# Patient Record
Sex: Female | Born: 1955 | Race: White | Hispanic: No | Marital: Married | Smoking: Never smoker
Health system: Southern US, Community
[De-identification: ages and names within clinical notes are randomized; demographics above are authoritative.]

## PROBLEM LIST (undated history)

## (undated) DIAGNOSIS — K648 Other hemorrhoids: Secondary | ICD-10-CM

## (undated) DIAGNOSIS — K5790 Diverticulosis of intestine, part unspecified, without perforation or abscess without bleeding: Secondary | ICD-10-CM

## (undated) DIAGNOSIS — C50919 Malignant neoplasm of unspecified site of unspecified female breast: Secondary | ICD-10-CM

## (undated) DIAGNOSIS — H269 Unspecified cataract: Secondary | ICD-10-CM

## (undated) DIAGNOSIS — I10 Essential (primary) hypertension: Secondary | ICD-10-CM

## (undated) DIAGNOSIS — M1990 Unspecified osteoarthritis, unspecified site: Secondary | ICD-10-CM

## (undated) HISTORY — DX: Unspecified cataract: H26.9

## (undated) HISTORY — DX: Essential (primary) hypertension: I10

## (undated) HISTORY — PX: LIPOMA EXCISION: SHX5283

## (undated) HISTORY — DX: Unspecified osteoarthritis, unspecified site: M19.90

## (undated) HISTORY — PX: BREAST LUMPECTOMY: SHX2

## (undated) HISTORY — PX: TONSILLECTOMY: SUR1361

## (undated) HISTORY — DX: Other hemorrhoids: K64.8

## (undated) HISTORY — DX: Diverticulosis of intestine, part unspecified, without perforation or abscess without bleeding: K57.90

## (undated) HISTORY — PX: EYE SURGERY: SHX253

## (undated) HISTORY — PX: WISDOM TOOTH EXTRACTION: SHX21

## (undated) HISTORY — PX: LAPAROSCOPIC ASSISTED VAGINAL HYSTERECTOMY: SHX5398

## (undated) HISTORY — DX: Malignant neoplasm of unspecified site of unspecified female breast: C50.919

## (undated) HISTORY — PX: COLONOSCOPY: SHX174

## (undated) MED FILL — Paclitaxel IV Conc 300 MG/50ML (6 MG/ML): INTRAVENOUS | Qty: 49 | Status: AC

---

## 1999-02-18 ENCOUNTER — Other Ambulatory Visit: Admission: RE | Admit: 1999-02-18 | Discharge: 1999-02-18 | Payer: Self-pay | Admitting: Family Medicine

## 2000-02-18 ENCOUNTER — Other Ambulatory Visit: Admission: RE | Admit: 2000-02-18 | Discharge: 2000-02-18 | Payer: Self-pay | Admitting: Family Medicine

## 2001-02-19 ENCOUNTER — Other Ambulatory Visit: Admission: RE | Admit: 2001-02-19 | Discharge: 2001-02-19 | Payer: Self-pay | Admitting: Family Medicine

## 2002-02-25 ENCOUNTER — Other Ambulatory Visit: Admission: RE | Admit: 2002-02-25 | Discharge: 2002-02-25 | Payer: Self-pay | Admitting: Family Medicine

## 2007-06-26 ENCOUNTER — Ambulatory Visit (HOSPITAL_COMMUNITY): Admission: RE | Admit: 2007-06-26 | Discharge: 2007-06-27 | Payer: Self-pay | Admitting: Ophthalmology

## 2011-04-19 NOTE — Op Note (Signed)
Alexandra Singh, Alexandra Singh                ACCOUNT NO.:  [id number]   MEDICAL RECORD NO.:  [id number]          PATIENT TYPE:  AMB   LOCATION:  SDS                          FACILITY:  [facility]   PHYSICIAN:  John D. Ashley Royalty, M.D. DATE OF BIRTH:  18-May-1956   DATE OF PROCEDURE:  06/26/2007  DATE OF DISCHARGE:                               OPERATIVE REPORT   ADMISSION DIAGNOSES:  Rhegmatogenous retinal detachment, right eye.   PROCEDURE PERFORMED:  Scleral buckle, right eye; retinal  photocoagulation, right eye; gas injection, right eye.   SURGEON:  Rudy Jew. Ashley Royalty, M.D.   ASSISTANT SURGEON:  Rosalie Doctor, M.A.   ANESTHESIA:  General.   DESCRIPTION OF PROCEDURE:  The usual prep and drape, 360-degree limbal  peritomy, isolation of four rectus muscles on 2-0 silk.  Localization of  breaks at 12 o'clock.  Sacral dissection for 360 degrees to admit a #279  intrascleral implant.  Additional posterior dissection carried out at  12:30 o'clock to admit the #508-G radial segment.  Diathermy was placed  in the bed.  The #279 implant was placed around the globe with the joint  at 5 o'clock.  A 240 band was placed around the globe with a 270 sleeve  at 5 o'clock.  The perforation site shows at 11 o'clock.  A large amount  clear colorless subretinal fluid came forth.   Perfluoropropane 35% was injected to reinflate the globe.  An additional  perforation site was chosen at 8 o'clock with a moderate amount of clear  colorless subretinal fluid coming forth.  Additional 35% C3F8 was  injected for a total of 2.1 mL.   Indirect ophthalmoscopy showed the retina to be lying nicely on the  scleral buckle with the  breaks well supported.   The indirect ophthalmoscope laser was moved into place.  Then 903 burns  were placed around the retinal periphery and around the retinal breaks.  The power was between 1000 milliwatts, 1,000 microns each, and 0.1  seconds each.  The buckle was adjusted and trimmed.  The  band was  adjusted and trimmed.  The conjunctiva was reposited with 7-0 chromic  suture.  Polymyxin and gentamicin were irrigated into Tenon's space.  Atropine solution was applied.  Marcaine was injected around the globe  for postoperative pain.  Decadron 10 mg was injected into the lower  subconjunctival space.  Closing pressure was 10 a Barraquer tonometer.  TobraDex ophthalmic ointment, a patch and shield were placed.  The  patient was awakened and taken to recovery in satisfactory condition.   COMPLICATIONS:  None.   DURATION:  Operative time was 2-1/2 hours.      Beulah Gandy. Ashley Royalty, M.D.  Electronically Signed     JDM/MEDQ  D:  06/26/2007  T:  06/27/2007  Job:  [id number]

## 2011-09-19 LAB — BASIC METABOLIC PANEL
BUN: 10
CO2: 27
Calcium: 9.1
Chloride: 107
Creatinine, Ser: 0.68
GFR calc Af Amer: >60

## 2011-09-19 LAB — DIFFERENTIAL
Basophils Absolute: 0.1
Basophils Relative: 1
Eosinophils Absolute: 0
Neutro Abs: 3.8
Neutrophils Relative %: 72

## 2011-09-19 LAB — CBC
MCHC: 34.3
MCV: 87.1
Platelets: 435 — ABNORMAL HIGH
RDW: 13.5

## 2013-02-13 ENCOUNTER — Encounter (INDEPENDENT_AMBULATORY_CARE_PROVIDER_SITE_OTHER): Payer: Managed Care, Other (non HMO) | Admitting: Ophthalmology

## 2013-02-13 DIAGNOSIS — H43819 Vitreous degeneration, unspecified eye: Secondary | ICD-10-CM

## 2013-02-13 DIAGNOSIS — H33009 Unspecified retinal detachment with retinal break, unspecified eye: Secondary | ICD-10-CM

## 2013-02-13 DIAGNOSIS — H2510 Age-related nuclear cataract, unspecified eye: Secondary | ICD-10-CM

## 2014-02-13 ENCOUNTER — Ambulatory Visit (INDEPENDENT_AMBULATORY_CARE_PROVIDER_SITE_OTHER): Payer: Managed Care, Other (non HMO) | Admitting: Ophthalmology

## 2014-02-13 DIAGNOSIS — H33009 Unspecified retinal detachment with retinal break, unspecified eye: Secondary | ICD-10-CM

## 2014-02-13 DIAGNOSIS — H43819 Vitreous degeneration, unspecified eye: Secondary | ICD-10-CM

## 2014-02-13 DIAGNOSIS — H2510 Age-related nuclear cataract, unspecified eye: Secondary | ICD-10-CM

## 2014-02-13 DIAGNOSIS — H35379 Puckering of macula, unspecified eye: Secondary | ICD-10-CM

## 2015-02-16 ENCOUNTER — Ambulatory Visit (INDEPENDENT_AMBULATORY_CARE_PROVIDER_SITE_OTHER): Payer: Managed Care, Other (non HMO) | Admitting: Ophthalmology

## 2015-02-16 DIAGNOSIS — M0609 Rheumatoid arthritis without rheumatoid factor, multiple sites: Secondary | ICD-10-CM

## 2015-02-16 DIAGNOSIS — H43813 Vitreous degeneration, bilateral: Secondary | ICD-10-CM

## 2015-02-16 DIAGNOSIS — H35373 Puckering of macula, bilateral: Secondary | ICD-10-CM

## 2015-02-16 DIAGNOSIS — Z79899 Other long term (current) drug therapy: Secondary | ICD-10-CM | POA: Diagnosis not present

## 2016-02-16 ENCOUNTER — Ambulatory Visit (INDEPENDENT_AMBULATORY_CARE_PROVIDER_SITE_OTHER): Payer: Managed Care, Other (non HMO) | Admitting: Ophthalmology

## 2016-02-16 DIAGNOSIS — H35371 Puckering of macula, right eye: Secondary | ICD-10-CM

## 2016-02-16 DIAGNOSIS — H338 Other retinal detachments: Secondary | ICD-10-CM

## 2016-02-16 DIAGNOSIS — H26492 Other secondary cataract, left eye: Secondary | ICD-10-CM | POA: Diagnosis not present

## 2016-02-16 DIAGNOSIS — H43813 Vitreous degeneration, bilateral: Secondary | ICD-10-CM | POA: Diagnosis not present

## 2016-02-16 DIAGNOSIS — M069 Rheumatoid arthritis, unspecified: Secondary | ICD-10-CM

## 2016-03-02 ENCOUNTER — Ambulatory Visit (INDEPENDENT_AMBULATORY_CARE_PROVIDER_SITE_OTHER): Payer: Managed Care, Other (non HMO) | Admitting: Ophthalmology

## 2016-03-02 DIAGNOSIS — H2702 Aphakia, left eye: Secondary | ICD-10-CM

## 2016-03-10 DIAGNOSIS — M0609 Rheumatoid arthritis without rheumatoid factor, multiple sites: Secondary | ICD-10-CM | POA: Insufficient documentation

## 2016-03-16 DIAGNOSIS — E559 Vitamin D deficiency, unspecified: Secondary | ICD-10-CM | POA: Insufficient documentation

## 2017-03-02 ENCOUNTER — Ambulatory Visit (INDEPENDENT_AMBULATORY_CARE_PROVIDER_SITE_OTHER): Payer: Managed Care, Other (non HMO) | Admitting: Ophthalmology

## 2017-03-02 DIAGNOSIS — H338 Other retinal detachments: Secondary | ICD-10-CM | POA: Diagnosis not present

## 2017-03-02 DIAGNOSIS — M069 Rheumatoid arthritis, unspecified: Secondary | ICD-10-CM

## 2017-03-02 DIAGNOSIS — H35373 Puckering of macula, bilateral: Secondary | ICD-10-CM

## 2017-03-02 DIAGNOSIS — H43813 Vitreous degeneration, bilateral: Secondary | ICD-10-CM | POA: Diagnosis not present

## 2018-03-02 ENCOUNTER — Ambulatory Visit (INDEPENDENT_AMBULATORY_CARE_PROVIDER_SITE_OTHER): Payer: Managed Care, Other (non HMO) | Admitting: Ophthalmology

## 2018-03-02 DIAGNOSIS — M069 Rheumatoid arthritis, unspecified: Secondary | ICD-10-CM

## 2018-03-02 DIAGNOSIS — H35373 Puckering of macula, bilateral: Secondary | ICD-10-CM | POA: Diagnosis not present

## 2018-03-02 DIAGNOSIS — H338 Other retinal detachments: Secondary | ICD-10-CM

## 2018-03-02 DIAGNOSIS — H43813 Vitreous degeneration, bilateral: Secondary | ICD-10-CM

## 2018-07-05 ENCOUNTER — Encounter: Payer: Self-pay | Admitting: Gastroenterology

## 2019-03-08 ENCOUNTER — Encounter (INDEPENDENT_AMBULATORY_CARE_PROVIDER_SITE_OTHER): Payer: Managed Care, Other (non HMO) | Admitting: Ophthalmology

## 2019-04-16 ENCOUNTER — Other Ambulatory Visit: Payer: Self-pay

## 2019-04-16 ENCOUNTER — Encounter (INDEPENDENT_AMBULATORY_CARE_PROVIDER_SITE_OTHER): Payer: Managed Care, Other (non HMO) | Admitting: Ophthalmology

## 2019-04-16 DIAGNOSIS — H338 Other retinal detachments: Secondary | ICD-10-CM | POA: Diagnosis not present

## 2019-04-16 DIAGNOSIS — H43813 Vitreous degeneration, bilateral: Secondary | ICD-10-CM | POA: Diagnosis not present

## 2019-04-16 DIAGNOSIS — M069 Rheumatoid arthritis, unspecified: Secondary | ICD-10-CM

## 2019-04-16 DIAGNOSIS — H35371 Puckering of macula, right eye: Secondary | ICD-10-CM | POA: Diagnosis not present

## 2019-06-05 ENCOUNTER — Encounter: Payer: Self-pay | Admitting: Gastroenterology

## 2019-06-11 ENCOUNTER — Ambulatory Visit (AMBULATORY_SURGERY_CENTER): Payer: Managed Care, Other (non HMO) | Admitting: *Deleted

## 2019-06-11 ENCOUNTER — Other Ambulatory Visit: Payer: Self-pay

## 2019-06-11 VITALS — Ht 67.0 in | Wt 132.0 lb

## 2019-06-11 DIAGNOSIS — Z1211 Encounter for screening for malignant neoplasm of colon: Secondary | ICD-10-CM

## 2019-06-11 MED ORDER — SUPREP BOWEL PREP KIT 17.5-3.13-1.6 GM/177ML PO SOLN: 1.0000 | Freq: Once | ORAL | 0 refills | Status: AC

## 2019-06-11 NOTE — Progress Notes (Signed)
Patient denies any allergies to egg or soy products. Patient denies complications with anesthesia/sedation.  Patient denies oxygen use at home and denies diet medications.   Pt verified name, DOB, address and insurance during PV today. Pt mailed instruction packet to included paper to complete and mail back to [facility] with addressed and stamped envelope, Emmi video, copy of consent form to read and not return, and instructions.Suprep coupon mailed in packet. PV completed over the phone. Pt encouraged to call with questions or issues.

## 2019-06-12 ENCOUNTER — Encounter: Payer: Self-pay | Admitting: Gastroenterology

## 2019-06-21 ENCOUNTER — Telehealth: Payer: Self-pay | Admitting: Gastroenterology

## 2019-06-21 NOTE — Telephone Encounter (Signed)

## 2019-06-24 ENCOUNTER — Encounter: Payer: Self-pay | Admitting: Gastroenterology

## 2019-06-24 ENCOUNTER — Ambulatory Visit (AMBULATORY_SURGERY_CENTER): Payer: Managed Care, Other (non HMO) | Admitting: Gastroenterology

## 2019-06-24 ENCOUNTER — Other Ambulatory Visit: Payer: Self-pay

## 2019-06-24 VITALS — BP 118/67 | HR 71 | Temp 98.9°F | Resp 13 | Ht 67.0 in | Wt 132.0 lb

## 2019-06-24 DIAGNOSIS — Z1211 Encounter for screening for malignant neoplasm of colon: Secondary | ICD-10-CM | POA: Diagnosis not present

## 2019-06-24 MED ORDER — SODIUM CHLORIDE 0.9 % IV SOLN: 500.0000 mL | Freq: Once | INTRAVENOUS | Status: DC

## 2019-06-24 NOTE — Op Note (Signed)
[facility] Patient Name: Alexandra Singh Procedure Date: 06/24/2019 8:10 AM MRN: [id number] Endoscopist: Jackquline Denmark , MD Age: 63 Referring MD:  Date of Birth: 06/13/1956 Gender: Female Account #: [id number] Procedure:                Colonoscopy Indications:              Screening for colorectal malignant neoplasm Medicines:                Monitored Anesthesia Care Procedure:                Pre-Anesthesia Assessment:                           - Prior to the procedure, a History and Physical                            was performed, and patient medications and                            allergies were reviewed. The patient's tolerance of                            previous anesthesia was also reviewed. The risks                            and benefits of the procedure and the sedation                            options and risks were discussed with the patient.                            All questions were answered, and informed consent                            was obtained. Prior Anticoagulants: The patient has                            taken no previous anticoagulant or antiplatelet                            agents. ASA Grade Assessment: I - A normal, healthy                            patient. After reviewing the risks and benefits,                            the patient was deemed in satisfactory condition to                            undergo the procedure.                           After obtaining informed consent, the colonoscope  was passed under direct vision. Throughout the                            procedure, the patient's blood pressure, pulse, and                            oxygen saturations were monitored continuously. The                            Colonoscope was introduced through the anus and                            advanced to the 2 cm into the ileum. The                            colonoscopy was performed without  difficulty. The                            patient tolerated the procedure well. The quality                            of the bowel preparation was good. The terminal                            ileum, ileocecal valve, appendiceal orifice, and                            rectum were photographed. Scope In: 8:22:07 AM Scope Out: 8:32:33 AM Scope Withdrawal Time: 0 hours 6 minutes 7 seconds  Total Procedure Duration: 0 hours 10 minutes 26 seconds  Findings:                 Multiple small-mouthed diverticula were found in                            the sigmoid colon, descending colon and few in                            ascending colon.                           Non-bleeding internal hemorrhoids were found during                            retroflexion. The hemorrhoids were small.                           The terminal ileum appeared normal.                           The exam was otherwise without abnormality on                            direct and retroflexion views. Complications:            No immediate complications. Estimated  Blood Loss:     Estimated blood loss: none. Impression:               - Pancolonic diverticulosis predominantly in the                            sigmoid colon.                           - Non-bleeding internal hemorrhoids.                           - Otherwise normal to TI. Recommendation:           - Patient has a contact number available for                            emergencies. The signs and symptoms of potential                            delayed complications were discussed with the                            patient. Return to normal activities tomorrow.                            Written discharge instructions were provided to the                            patient.                           - High fiber diet.                           - Continue present medications.                           - Brochures regarding diverticulosis.                            - Repeat colonoscopy in 10 years for screening                            purposes. Earlier, if with any new problems or if                            there is any change in family history.                           - Return to GI office PRN. Jackquline Denmark, MD 06/24/2019 8:39:48 AM This report has been signed electronically.

## 2019-06-24 NOTE — Progress Notes (Signed)
Pt's states no medical or surgical changes since previsit or office visit. 

## 2019-06-24 NOTE — Progress Notes (Signed)
Vitals by Mohammed Kindle Temperature/Vitals Rica Mote

## 2019-06-24 NOTE — Progress Notes (Signed)
No problems noted in the recovery room. maw 

## 2019-06-24 NOTE — Patient Instructions (Signed)
YOU HAD AN ENDOSCOPIC PROCEDURE TODAY AT [facility]:   Refer to the procedure report that was given to you for any specific questions about what was found during the examination.  If the procedure report does not answer your questions, please call your gastroenterologist to clarify.  If you requested that your care partner not be given the details of your procedure findings, then the procedure report has been included in a sealed envelope for you to review at your convenience later.  YOU SHOULD EXPECT: Some feelings of bloating in the abdomen. Passage of more gas than usual.  Walking can help get rid of the air that was put into your GI tract during the procedure and reduce the bloating. If you had a lower endoscopy (such as a colonoscopy or flexible sigmoidoscopy) you may notice spotting of blood in your stool or on the toilet paper. If you underwent a bowel prep for your procedure, you may not have a normal bowel movement for a few days.  Please Note:  You might notice some irritation and congestion in your nose or some drainage.  This is from the oxygen used during your procedure.  There is no need for concern and it should clear up in a day or so.  SYMPTOMS TO REPORT IMMEDIATELY:   Following lower endoscopy (colonoscopy or flexible sigmoidoscopy):  Excessive amounts of blood in the stool  Significant tenderness or worsening of abdominal pains  Swelling of the abdomen that is new, acute  Fever of 100F or higher   For urgent or emergent issues, a gastroenterologist can be reached at any hour by calling [phone].   DIET:  We do recommend a small meal at first, but then you may proceed to your regular diet.  Drink plenty of fluids but you should avoid alcoholic beverages for 24 hours.  ACTIVITY:  You should plan to take it easy for the rest of today and you should NOT DRIVE or use heavy machinery until tomorrow (because of the sedation medicines used during the test).     FOLLOW UP: Our staff will call the number listed on your records 48-72 hours following your procedure to check on you and address any questions or concerns that you may have regarding the information given to you following your procedure. If we do not reach you, we will leave a message.  We will attempt to reach you two times.  During this call, we will ask if you have developed any symptoms of COVID 19. If you develop any symptoms (ie: fever, flu-like symptoms, shortness of breath, cough etc.) before then, please call [phone].  If you test positive for Covid 19 in the 2 weeks post procedure, please call and report this information to [place].    If any biopsies were taken you will be contacted by phone or by letter within the next 1-3 weeks.  Please call us at [phone] if you have not heard about the biopsies in 3 weeks.    SIGNATURES/CONFIDENTIALITY: You and/or your care partner have signed paperwork which will be entered into your electronic medical record.  These signatures attest to the fact that that the information above on your After Visit Summary has been reviewed and is understood.  Full responsibility of the confidentiality of this discharge information lies with you and/or your care-partner.    Handouts were given to you on diverticulosis, a high fiber diet and hemorrhoids. You may resume your current medications today. Repeat screening colonoscopy in  10 years. Please call if any questions or concerns.

## 2019-06-24 NOTE — Progress Notes (Signed)
PT taken to PACU. Monitors in place. VSS. Report given to RN. 

## 2019-06-26 ENCOUNTER — Telehealth: Payer: Self-pay

## 2019-06-26 ENCOUNTER — Telehealth: Payer: Self-pay | Admitting: *Deleted

## 2019-06-26 NOTE — Telephone Encounter (Signed)
  Follow up Call-  Call back number 06/24/2019  Post procedure Call Back phone  # [phone]  Permission to leave phone message Yes  Some recent data might be hidden     Patient questions:  Do you have a fever, pain , or abdominal swelling? No. Pain Score  0 *  Have you tolerated food without any problems? Yes.    Have you been able to return to your normal activities? Yes.    Do you have any questions about your discharge instructions: Diet   No. Medications  No. Follow up visit  No.  Do you have questions or concerns about your Care? No.  Actions: * If pain score is 4 or above: No action needed, pain <4. 1. Have you developed a fever since your procedure? no  2.   Have you had an respiratory symptoms (SOB or cough) since your procedure? no  3.   Have you tested positive for COVID 19 since your procedure no  4.   Have you had any family members/close contacts diagnosed with the COVID 19 since your procedure?  no   If yes to any of these questions please route to Joylene John, RN and Alphonsa Gin, Therapist, sports.

## 2019-06-26 NOTE — Telephone Encounter (Signed)
No answer for first procedure call back. LEft message and will call back this afternoon. SM

## 2020-02-19 DIAGNOSIS — I10 Essential (primary) hypertension: Secondary | ICD-10-CM | POA: Insufficient documentation

## 2020-04-16 ENCOUNTER — Encounter (INDEPENDENT_AMBULATORY_CARE_PROVIDER_SITE_OTHER): Payer: Managed Care, Other (non HMO) | Admitting: Ophthalmology

## 2020-04-16 ENCOUNTER — Other Ambulatory Visit: Payer: Self-pay

## 2020-04-16 DIAGNOSIS — M069 Rheumatoid arthritis, unspecified: Secondary | ICD-10-CM | POA: Diagnosis not present

## 2020-04-16 DIAGNOSIS — H338 Other retinal detachments: Secondary | ICD-10-CM

## 2020-04-16 DIAGNOSIS — H35373 Puckering of macula, bilateral: Secondary | ICD-10-CM

## 2020-04-16 DIAGNOSIS — H43813 Vitreous degeneration, bilateral: Secondary | ICD-10-CM | POA: Diagnosis not present

## 2021-04-20 ENCOUNTER — Other Ambulatory Visit: Payer: Self-pay

## 2021-04-20 ENCOUNTER — Encounter (INDEPENDENT_AMBULATORY_CARE_PROVIDER_SITE_OTHER): Payer: Managed Care, Other (non HMO) | Admitting: Ophthalmology

## 2021-04-20 DIAGNOSIS — H43813 Vitreous degeneration, bilateral: Secondary | ICD-10-CM | POA: Diagnosis not present

## 2021-04-20 DIAGNOSIS — H35373 Puckering of macula, bilateral: Secondary | ICD-10-CM | POA: Diagnosis not present

## 2021-04-20 DIAGNOSIS — Z79899 Other long term (current) drug therapy: Secondary | ICD-10-CM

## 2021-04-20 DIAGNOSIS — M069 Rheumatoid arthritis, unspecified: Secondary | ICD-10-CM | POA: Diagnosis not present

## 2021-04-20 DIAGNOSIS — H338 Other retinal detachments: Secondary | ICD-10-CM | POA: Diagnosis not present

## 2022-01-13 NOTE — Progress Notes (Signed)
Initial phone contact with newly diagnosed patient. Appt made with Dr. Noberto Retort per pt request for 01/25/2022. Encouraged pt to stop by and pick up "The Breast Cancer Treatment Handbook". Encouraged pt to call with questions or concerns.

## 2022-01-21 DIAGNOSIS — F411 Generalized anxiety disorder: Secondary | ICD-10-CM | POA: Insufficient documentation

## 2022-01-26 ENCOUNTER — Other Ambulatory Visit: Payer: Self-pay | Admitting: Vascular Surgery

## 2022-01-26 DIAGNOSIS — C50412 Malignant neoplasm of upper-outer quadrant of left female breast: Secondary | ICD-10-CM

## 2022-02-04 ENCOUNTER — Ambulatory Visit
Admission: RE | Admit: 2022-02-04 | Discharge: 2022-02-04 | Disposition: A | Discharge status: Home / Self Care | Payer: Managed Care, Other (non HMO) | Source: Ambulatory Visit | Attending: Vascular Surgery | Admitting: Vascular Surgery

## 2022-02-04 ENCOUNTER — Other Ambulatory Visit: Payer: Self-pay

## 2022-02-04 DIAGNOSIS — C50412 Malignant neoplasm of upper-outer quadrant of left female breast: Secondary | ICD-10-CM

## 2022-02-04 MED ORDER — GADOBUTROL 1 MMOL/ML IV SOLN
6.0000 mL | Freq: Once | INTRAVENOUS | Status: AC | PRN
  Administered 2022-02-04: 6 mL via INTRAVENOUS

## 2022-02-08 ENCOUNTER — Other Ambulatory Visit: Payer: Self-pay | Admitting: Vascular Surgery

## 2022-02-08 DIAGNOSIS — R9389 Abnormal findings on diagnostic imaging of other specified body structures: Secondary | ICD-10-CM

## 2022-02-11 ENCOUNTER — Other Ambulatory Visit: Payer: Self-pay

## 2022-02-11 ENCOUNTER — Other Ambulatory Visit (HOSPITAL_COMMUNITY): Payer: Self-pay | Admitting: Diagnostic Radiology

## 2022-02-11 ENCOUNTER — Ambulatory Visit
Admission: RE | Admit: 2022-02-11 | Discharge: 2022-02-11 | Disposition: A | Discharge status: Home / Self Care | Payer: Managed Care, Other (non HMO) | Source: Ambulatory Visit | Attending: Vascular Surgery | Admitting: Vascular Surgery

## 2022-02-11 DIAGNOSIS — R9389 Abnormal findings on diagnostic imaging of other specified body structures: Secondary | ICD-10-CM

## 2022-02-11 MED ORDER — GADOBUTROL 1 MMOL/ML IV SOLN
6.0000 mL | Freq: Once | INTRAVENOUS | Status: AC | PRN
  Administered 2022-02-11: 6 mL via INTRAVENOUS

## 2022-02-14 ENCOUNTER — Other Ambulatory Visit: Payer: Self-pay | Admitting: Oncology

## 2022-02-14 DIAGNOSIS — C50112 Malignant neoplasm of central portion of left female breast: Secondary | ICD-10-CM

## 2022-02-15 ENCOUNTER — Telehealth: Payer: Self-pay | Admitting: Oncology

## 2022-02-15 ENCOUNTER — Inpatient Hospital Stay: Payer: Managed Care, Other (non HMO)

## 2022-02-15 ENCOUNTER — Other Ambulatory Visit: Payer: Self-pay

## 2022-02-15 ENCOUNTER — Inpatient Hospital Stay: Payer: Managed Care, Other (non HMO) | Attending: Oncology | Admitting: Oncology

## 2022-02-15 ENCOUNTER — Other Ambulatory Visit: Payer: Self-pay | Admitting: Oncology

## 2022-02-15 DIAGNOSIS — C50112 Malignant neoplasm of central portion of left female breast: Secondary | ICD-10-CM | POA: Diagnosis not present

## 2022-02-15 DIAGNOSIS — C50919 Malignant neoplasm of unspecified site of unspecified female breast: Secondary | ICD-10-CM | POA: Insufficient documentation

## 2022-02-15 DIAGNOSIS — Z170 Estrogen receptor positive status [ER+]: Secondary | ICD-10-CM | POA: Diagnosis not present

## 2022-02-15 DIAGNOSIS — Z5111 Encounter for antineoplastic chemotherapy: Secondary | ICD-10-CM | POA: Insufficient documentation

## 2022-02-15 DIAGNOSIS — C50912 Malignant neoplasm of unspecified site of left female breast: Secondary | ICD-10-CM | POA: Insufficient documentation

## 2022-02-15 LAB — BASIC METABOLIC PANEL
BUN: 15 (ref 4–21)
CO2: 31 — AB (ref 13–22)
Chloride: 103 (ref 99–108)
Creatinine: 0.7 (ref 0.5–1.1)
Glucose: 98
Potassium: 3.9 mEq/L (ref 3.5–5.1)
Sodium: 140 (ref 137–147)

## 2022-02-15 LAB — COMPREHENSIVE METABOLIC PANEL
Albumin: 4.5 (ref 3.5–5.0)
Calcium: 9.3 (ref 8.7–10.7)

## 2022-02-15 LAB — CBC AND DIFFERENTIAL
HCT: 42 (ref 36–46)
Hemoglobin: 13.8 (ref 12.0–16.0)
Neutrophils Absolute: 4.16
Platelets: 348 10*3/uL (ref 150–400)
WBC: 6.4

## 2022-02-15 LAB — HEPATIC FUNCTION PANEL
ALT: 23 U/L (ref 7–35)
AST: 27 (ref 13–35)
Alkaline Phosphatase: 66 (ref 25–125)
Bilirubin, Total: 0.7

## 2022-02-15 LAB — CBC: RBC: 4.7 (ref 3.87–5.11)

## 2022-02-15 NOTE — Telephone Encounter (Signed)
Per 02/15/22 los next appt scheduled and confirmed with patient ?

## 2022-02-15 NOTE — Progress Notes (Signed)
Face to face visit with pt in [place] lobby. Pt. Is here for her initial consult with Medical Oncology. Pt has been referred on since the results of the MRI make it questionable as to whether neoadjuvant chemo will be needed. Pt is aware that this is a possibility. She admits being nervous but is ready to proceed with whatever needs to be done. Encouraged pt to call with questions or concerns. ?

## 2022-02-15 NOTE — Progress Notes (Signed)
START ON PATHWAY REGIMEN - Breast ? ? ?  Cycles 1 through 4 = every 14 days: ?    Paclitaxel  ?    Pegfilgrastim-xxxx  ?  Cycles 5 through 8 = every 14 days: ?    Doxorubicin  ?    Cyclophosphamide  ?    Pegfilgrastim-xxxx  ? ?**Always confirm dose/schedule in your pharmacy ordering system** ? ?Patient Characteristics: ?Preoperative or Nonsurgical Candidate (Clinical Staging), Neoadjuvant Therapy followed by Surgery, Invasive Disease, Chemotherapy, HER2 Negative/Unknown/Equivocal, ER Positive ?Therapeutic Status: Preoperative or Nonsurgical Candidate (Clinical Staging) ?AJCC M Category: cM0 ?AJCC Grade: G2 ?Breast Surgical Plan: Neoadjuvant Therapy followed by Surgery ?ER Status: Positive (+) ?AJCC 8 Stage Grouping: IIIA ?HER2 Status: Negative (-) ?AJCC T Category: cT3 ?AJCC N Category: cN1 ?PR Status: Negative (-) ?Intent of Therapy: ?Curative Intent, Discussed with Patient ?

## 2022-02-17 ENCOUNTER — Encounter: Payer: Self-pay | Admitting: Hematology and Oncology

## 2022-02-17 ENCOUNTER — Inpatient Hospital Stay: Payer: Managed Care, Other (non HMO) | Admitting: Hematology and Oncology

## 2022-02-17 ENCOUNTER — Other Ambulatory Visit: Payer: Self-pay

## 2022-02-17 VITALS — BP 150/72 | HR 68 | Temp 98.5°F | Resp 18 | Ht 66.25 in | Wt 134.9 lb

## 2022-02-17 DIAGNOSIS — Z170 Estrogen receptor positive status [ER+]: Secondary | ICD-10-CM

## 2022-02-17 DIAGNOSIS — C50112 Malignant neoplasm of central portion of left female breast: Secondary | ICD-10-CM | POA: Diagnosis not present

## 2022-02-17 MED ORDER — ONDANSETRON HCL 8 MG PO TABS: 8.0000 mg | ORAL_TABLET | Freq: Two times a day (BID) | ORAL | 1 refills | Status: DC | PRN

## 2022-02-17 MED ORDER — DEXAMETHASONE 4 MG PO TABS: ORAL_TABLET | ORAL | 1 refills | Status: DC

## 2022-02-17 MED ORDER — PROCHLORPERAZINE MALEATE 10 MG PO TABS: 10.0000 mg | ORAL_TABLET | Freq: Four times a day (QID) | ORAL | 1 refills | Status: DC | PRN

## 2022-02-17 NOTE — Progress Notes (Cosign Needed)
[facility] CARE CLINIC CONSULT NOTE [facility]  [phone] [phone]  Patient Care Team: Audie Pinto, FNP as PCP - General (Family Medicine) Charlyne Petrin, RN as Registered Nurse   Name of the patient: Alexandra Singh  [id number]  05-10-1956   Date of visit: 02/17/22  Diagnosis- Breast cancer  Chief complaint/Reason for visit- Initial Meeting for [facility], preparing for starting chemotherapy   Heme/Onc history:  Oncology History  Malignant neoplasm of female breast (HCC)  02/15/2022 Initial Diagnosis   Malignant neoplasm of female breast (HCC)   02/15/2022 Cancer Staging   Staging form: Breast, AJCC 8th Edition - Clinical stage from 02/15/2022: Stage IIIA (cT3, cN1, cM0, G2, ER+, PR-, HER2-) - Signed by Weston Settle, MD on 02/15/2022 Histopathologic type: Infiltrating duct carcinoma, NOS Stage prefix: Initial diagnosis Histologic grading system: 3 grade system    02/24/2022 -  Chemotherapy   Patient is on Treatment Plan : BREAST DOSE DENSE Paclitaxel q14d x 4 cycles / BREAST DOSE DENSE AC q14d x 4 cycles       Interval history-  Patient presents to chemo care clinic today for initial meeting in preparation for starting chemotherapy. I introduced the chemo care clinic and we discussed that the role of the clinic is to assist those who are at an increased risk of emergency room visits and/or complications during the course of chemotherapy treatment. We discussed that the increased risk takes into account factors such as age, performance status, and co-morbidities. We also discussed that for some, this might include barriers to care such as not having a primary care provider, lack of insurance/transportation, or not being able to afford medications. We discussed that the goal of the program is to help prevent unplanned ER visits and help reduce complications during chemotherapy. We do this by discussing specific risk factors to each  individual and identifying ways that we can help improve these risk factors and reduce barriers to care.   Allergies  Allergen Reactions   Sulfamethoxazole Itching and Rash    Past Medical History:  Diagnosis Date   Arthritis    RA   Cataract    HX - surgery to remove - bilateral   Hypertension     Past Surgical History:  Procedure Laterality Date   COLONOSCOPY     10 yrs ago - normal   EYE SURGERY     right - detached retina repair   EYE SURGERY     bilateral - cataracts removed   LAPAROSCOPIC ASSISTED VAGINAL HYSTERECTOMY     LIPOMA EXCISION     right breast   TONSILLECTOMY     WISDOM TOOTH EXTRACTION      Social History   Socioeconomic History   Marital status: Single    Spouse name: Not on file   Number of children: Not on file   Years of education: Not on file   Highest education level: Not on file  Occupational History   Not on file  Tobacco Use   Smoking status: Never   Smokeless tobacco: Never  Vaping Use   Vaping Use: Never used  Substance and Sexual Activity   Alcohol use: Not on file    Comment: bi-weekly 1-2 drinks   Drug use: Never   Sexual activity: Not on file  Other Topics Concern   Not on file  Social History Narrative   Not on file   Social Determinants of Health   Financial Resource Strain: Not on  file  Food Insecurity: Not on file  Transportation Needs: Not on file  Physical Activity: Not on file  Stress: Not on file  Social Connections: Not on file  Intimate Partner Violence: Not on file    Family History  Problem Relation Age of Onset   Colon cancer Neg Hx    Rectal cancer Neg Hx    Stomach cancer Neg Hx    Colon polyps Neg Hx      Current Outpatient Medications:    Cholecalciferol (VITAMIN D3) 50 MCG (2000 UT) capsule, Take by mouth., Disp: , Rfl:    cyanocobalamin 1000 MCG tablet, Take 1 tablet by mouth daily., Disp: , Rfl:    folic acid (FOLVITE) 1 MG tablet, , Disp: , Rfl:    hydroxychloroquine (PLAQUENIL) 200  MG tablet, Take 1 tablet by mouth daily., Disp: , Rfl:    methotrexate (RHEUMATREX) 2.5 MG tablet, Takes on Monday weekly, Disp: , Rfl:    metoprolol succinate (TOPROL-XL) 25 MG 24 hr tablet, at bedtime. , Disp: , Rfl:    OVER THE COUNTER MEDICATION, Take 1 capsule by mouth 3 (three) times a week. beta-carotene,A,-vits C,E/mins (OCUVITE ORAL), Disp: , Rfl:   CMP Latest Ref Rng & Units 02/15/2022  Glucose - -  BUN 4 - 21 15  Creatinine 0.5 - 1.1 0.7  Sodium 137 - 147 140  Potassium 3.5 - 5.1 mEq/L 3.9  Chloride 99 - 108 103  CO2 13 - 22 31(A)  Calcium 8.7 - 10.7 9.3  Alkaline Phos 25 - 125 66  AST 13 - 35 27  ALT 7 - 35 U/L 23   CBC Latest Ref Rng & Units 02/15/2022  WBC - 6.4  Hemoglobin 12.0 - 16.0 13.8  Hematocrit 36 - 46 42  Platelets 150 - 400 K/uL 348    No images are attached to the encounter.  MR BREAST BILATERAL W WO CONTRAST INC CAD  Result Date: 02/04/2022 CLINICAL DATA:  Left breast swelling since December 2022. Recently diagnosed left breast cancer. No abnormal lymph nodes seen in the left axilla on the December 07, 2021 diagnostic mammogram. Prior right breast lumpectomy for lipoma 1999. EXAM: BILATERAL BREAST MRI WITH AND WITHOUT CONTRAST TECHNIQUE: Multiplanar, multisequence MR images of both breasts were obtained prior to and following the intravenous administration of 6 ml of Gadavist Three-dimensional MR images were rendered by post-processing of the original MR data on an independent workstation. The three-dimensional MR images were interpreted, and findings are reported in the following complete MRI report for this study. Three dimensional images were evaluated at the independent interpreting workstation using the DynaCAD thin client. COMPARISON:  Mammography January 03, 2022 FINDINGS: Breast composition: c. Heterogeneous fibroglandular tissue. Background parenchymal enhancement: Minimal Right breast: There is linear enhancement in the right central breast at the level of  the nipple spanning 8.5 mm seen on series 10, images 72 through 76. No other abnormalities are identified in the right breast. Left breast: The patient's known malignancy is much larger than appreciated on mammogram and ultrasound. The malignancy encompasses nearly the entire upper inner and outer quadrants. The abnormal enhancement associated with the malignancy also extends inferior to the nipple into the lower inner and outer quadrants. The malignancy abuts the posterior aspect of the nipple with nipple inversion. There is mild associated skin thickening on the left. The malignancy measures 8.7 by 5.0 cm in transverse and AP dimensions. The malignancy measures up to 9.8 cm in cranial caudal dimension. Lymph nodes: Despite the  normal ultrasound at the time of diagnostic mammography, there appears to be at least 1 abnormal node in the left axilla as seen on series 4, image 36 with cortex measuring up the 7.3 mm. Overall, the lymph nodes on the left are larger than those on the right. Ancillary findings:  None. IMPRESSION: 1. 8.5 mm of linear enhancement in the right central breast on series 10, images 72-76. 2. The patient's known malignancy on the left measures at least 8.7 x 5.0 x 9.8 cm in transverse, AP, and craniocaudal dimensions. Nearly the entirety of the upper inner and outer quadrants is involved. A smaller amount of the lower inner and outer quadrants is involved. There is associated nipple inversion and skin thickening. 3. There is a mildly abnormal lymph node with a cortex measuring 7 mm in the left axilla not appreciated on the January 03, 2022 diagnostic mammogram and ultrasound. RECOMMENDATION: 1. Recommend second-look ultrasound in the left axilla. If the mildly abnormal node seen on today's study is identified, ultrasound-guided biopsy is recommended. 2. Recommend MRI guided biopsy of the linear enhancement in the right breast. 3. Based on the MRI, it appears the patient will need a left  mastectomy rather than lumpectomy. If breast conservation is being considered, 2 biopsies could be obtained on the left to prove extent of disease. BI-RADS CATEGORY  4: Suspicious. Electronically Signed   By: Gerome Sam III M.D.   On: 02/04/2022 17:29  MM CLIP PLACEMENT RIGHT  Result Date: 02/11/2022 CLINICAL DATA:  Status post MR guided core biopsy of non mass enhancement in the RIGHT breast. EXAM: 3D DIAGNOSTIC RIGHT MAMMOGRAM POST MRI BIOPSY COMPARISON:  Previous exam(s). FINDINGS: 3D Mammographic images were obtained following MRI guided biopsy of non mass enhancement in the UPPER central RIGHT breast and placement of a barbell clip. The biopsy marking clip is in expected position at the site of biopsy. IMPRESSION: Appropriate positioning of the barbell shaped biopsy marking clip at the site of biopsy in the UPPER central RIGHT breast. Final Assessment: Post Procedure Mammograms for Marker Placement Electronically Signed   By: Norva Pavlov M.D.   On: 02/11/2022 11:15  MR RT BREAST BX W LOC DEV 1ST LESION IMAGE BX SPEC MR GUIDE  Addendum Date: 02/14/2022   ADDENDUM REPORT: 02/14/2022 13:51 ADDENDUM: Pathology revealed BENIGN BREAST TISSUE WITH COLUMNAR CELL CHANGES AND INCREASED STROMAL FIBROSIS- NO MALIGNANCY IDENTIFIED of the RIGHT breast, upper central (barbell clip). This was found to be concordant by Dr. Norva Pavlov. Pathology results were discussed with the patient by telephone. The patient reported doing well after the biopsy with tenderness and bruising at the site. Post biopsy instructions and care were reviewed and questions were answered. The patient was encouraged to call [facility] for any additional concerns. The patient has a recent diagnosis of LEFT breast cancer and should follow her outlined treatment plan. Pathology results reported by Collene Mares RN on 02/14/2022. Electronically Signed   By: Norva Pavlov M.D.   On: 02/14/2022 13:51    Result Date: 02/14/2022 CLINICAL DATA:  Patient presents for MR guided core biopsy of non mass enhancement in the RIGHT breast. Recent diagnosis of LEFT breast malignancy. EXAM: MRI GUIDED CORE NEEDLE BIOPSY OF THE RIGHT BREAST TECHNIQUE: Multiplanar, multisequence MR imaging of the RIGHT breast was performed both before and after administration of intravenous contrast. CONTRAST:  Six COMPARISON:  Previous exams. FINDINGS: I met with the patient, and we discussed the procedure of MRI guided biopsy,  including risks, benefits, and alternatives. Specifically, we discussed the risks of infection, bleeding, tissue injury, clip migration, and inadequate sampling. Informed, written consent was given. The usual time out protocol was performed immediately prior to the procedure. Using sterile technique, 1% Lidocaine, MRI guidance, and a 9 gauge vacuum assisted device, biopsy was performed of non mass enhancement in the UPPER anterior RIGHT breast using a LATERAL to MEDIAL approach. At the conclusion of the procedure, a barbell tissue marker clip was deployed into the biopsy cavity. Follow-up 2-view mammogram was performed and dictated separately. IMPRESSION: MRI guided biopsy of RIGHT mass non mass enhancement. No apparent complications. Electronically Signed: By: Norva Pavlov M.D. On: 02/11/2022 11:14    Assessment and plan- Patient is a 66 y.o. female who presents to [facility] for initial meeting in preparation for starting chemotherapy for the treatment of breast cancer.   Chemo Care Clinic/High Risk for ER/Hospitalization during chemotherapy- We discussed the role of the chemo care clinic and identified patient specific risk factors. I discussed that patient was identified as high risk primarily based on:  Patient has past medical history positive for: Past Medical History:  Diagnosis Date   Arthritis    RA   Cataract    HX - surgery to remove - bilateral   Hypertension     Patient has  past surgical history positive for: Past Surgical History:  Procedure Laterality Date   COLONOSCOPY     10 yrs ago - normal   EYE SURGERY     right - detached retina repair   EYE SURGERY     bilateral - cataracts removed   LAPAROSCOPIC ASSISTED VAGINAL HYSTERECTOMY     LIPOMA EXCISION     right breast   TONSILLECTOMY     WISDOM TOOTH EXTRACTION     Provided general information including the following: 1.  Date of education: 02/17/2022 2.  Physician name: Dr. Melvyn Neth 3.  Diagnosis: Breast Cancer 4.  Stage: Stage IIIA 5.  Curative 6.  Chemotherapy plan including drugs and how often: Paclitaxel, Doxorubicin, Cyclophosphamide 7.  Start date: Pending port placement 8.  Other referrals: None at this time 9.  The patient is to call our office with any questions or concerns.  Our office number [phone], if after hours or on the weekend, call the same number and wait for the answering service.  There is always an oncologist on call 10.  Medications prescribed: Dexamethasone, Ondansetron, Prochlorperazine 11.  The patient has verbalized understanding of the treatment plan and has no barriers to adherence or understanding.  Obtained signed consent from patient.  Discussed symptoms including 1.  Low blood counts including red blood cells, white blood cells and platelets. 2. Infection including to avoid large crowds, wash hands frequently, and stay away from people who were sick.  If fever develops of 100.4 or higher, call our office. 3.  Mucositis-given instructions on mouth rinse (baking soda and salt mixture).  Keep mouth clean.  Use soft bristle toothbrush.  If mouth sores develop, call our clinic. 4.  Nausea/vomiting-gave prescriptions for ondansetron 4 mg every 4 hours as needed for nausea, may take around the clock if persistent.  Compazine 10 mg every 6 hours, may take around the clock if persistent. 5.  Diarrhea-use over-the-counter Imodium.  Call clinic if not controlled. 6.   Constipation-use senna, 1 to 2 tablets twice a day.  If no BM in 2 to 3 days call the clinic. 7.  Loss of appetite-try to eat  small meals every 2-3 hours.  Call clinic if not eating. 8.  Taste changes-zinc 500 mg daily.  If becomes severe call clinic. 9.  Alcoholic beverages. 10.  Drink 2 to 3 quarts of water per day. 11.  Peripheral neuropathy-patient to call if numbness or tingling in hands or feet is persistent  Neulasta-will be given 24 to 48 hours after chemotherapy.  Gave information sheet on bone and joint pain.  Use Claritin or Pepcid.  May use ibuprofen or Aleve.  Call if symptoms persist or are unbearable.  Gave information on the supportive care team and how to contact them regarding services.  Discussed advanced directives.  The patient does not have their advanced directives but will look at the copy provided in their notebook and will call with any questions. Spiritual Nutrition Financial Social worker Advanced directives  Answered questions to patient satisfaction.  Patient is to call with any further questions or concerns.  Time spent on this palliative care/chemotherapy education was 60 minutes with more than 50% spent discussing diagnosis, prognosis and symptom management.  The medication prescribed to the patient will be printed out from [email or web address] This will give the following information: Name of your medication Approved uses Dose and schedule Storage and handling Handling body fluids and waste Drug and food interactions Possible side effects and management Pregnancy, sexual activity, and contraception Obtaining medication   We discussed that social determinants of health may have significant impacts on health and outcomes for cancer patients.  Today we discussed specific social determinants of performance status, alcohol use, depression, financial needs, food insecurity, housing, interpersonal violence, social connections, stress, tobacco use, and  transportation.    After lengthy discussion the following were identified as areas of need:   Outpatient services: We discussed options including home based and outpatient services, DME and care program. We discusssed that patients who participate in regular physical activity report fewer negative impacts of cancer and treatments and report less fatigue.   Financial Concerns: We discussed that living with cancer can create tremendous financial burden.  We discussed options for assistance. I asked that if assistance is needed in affording medications or paying bills to please let us know so that we can provide assistance. We discussed options for food including social services and onsite food pantry.  We will also notify Mady Haagensen to see if cancer center can provide additional support.  Referral to Social work: Introduced Child psychotherapist Mady Haagensen and the services she can provide such as support with utility bill, cell phone and gas vouchers.   Support groups: We discussed options for support groups at the cancer center. If interested, please notify nurse navigator to enroll. We discussed options for managing stress including healthy eating, exercise as well as participating in no charge counseling services at the cancer center and support groups.  If these are of interest, patient can notify either myself or primary nursing team.We discussed options for management including medications and referral to quit Smart program  Transportation: We discussed options for transportation.  I have notified primary oncology team who will help assist with arranging Zenaida Niece transportation for appointments when/if needed. We also discussed options for transportation on short notice/acute visits.  Palliative care services: We have palliative care services available in the cancer center to discuss goals of care and advanced care planning.  Please let us know if you have any questions or would like to speak to our  palliative nurse practitioner.  Symptom Management Clinic: We discussed our symptom  management clinic which is available for acute concerns while receiving treatment such as nausea, vomiting or diarrhea.  We can be reached via telephone at [phone] or through my chart.  We are available for virtual or in person visits on the same day from 830 to 4 PM Monday through Friday. She denies needing specific assistance at this time and She will be followed by Dr. Melvyn Neth clinical team.  Plan: Discussed symptom management clinic. Discussed palliative care services. Discussed resources that are available here at the cancer center. Discussed medications and new prescriptions to begin treatment such as anti-nausea or steroids.   Disposition: RTC on   Visit Diagnosis No diagnosis found.  Patient expressed understanding and was in agreement with this plan. She also understands that She can call clinic at any time with any questions, concerns, or complaints.   I provided 30 minutes of  face to face  during this encounter, and > 50% was spent counseling as documented under my assessment & plan.   Ilda Basset, FNP- [facility]

## 2022-02-18 ENCOUNTER — Encounter: Payer: Self-pay | Admitting: Oncology

## 2022-02-18 NOTE — Progress Notes (Signed)
[facility]  [address] [place],  [place]  [address] [phone]  Clinic Day:  02/18/2022  Referring physician: Audie Pinto, FNP   HISTORY OF PRESENT ILLNESS:  The patient is a 66 y.o. female who I was asked to consult upon for newly diagnosed breast cancer.  Her history dates back to December 2022 when she first began noticing her left breast being swollen.  Initially, antibiotics were given to treat the inflammation, but the swelling persisted.  This led to her getting a diagnostic mammogram in January 2023, which revealed a 3.2 cm mass in her left breast.  Of note, this patient had diagnostic mammograms in January 2022 and July 2022 which showed a benign-appearing 6mm lesion in her left breast which was recommended to be followed.  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  Of note, she also had a left axillary lymph node biopsied, which also came back consistent with carcinoma.  The patient eventually underwent a bilateral breast MRI, which showed that her left breast mass was actually much larger, measuring 9.8 cm in greatest dimension.  There was also a lesion seen in her right breast.  Fortunately, a biopsy of this lesion came back negative for malignancy.  The patient comes in today to go over her biopsy and scan results, as well as their implications.  To her knowledge, there is no family history of breast or ovarian cancer.    PAST MEDICAL HISTORY:   Past Medical History:  Diagnosis Date   Arthritis    RA   Cataract    HX - surgery to remove - bilateral   Hypertension     PAST SURGICAL HISTORY:   Past Surgical History:  Procedure Laterality Date   COLONOSCOPY     10 yrs ago - normal   EYE SURGERY     right - detached retina repair   EYE SURGERY     bilateral - cataracts  removed   LAPAROSCOPIC ASSISTED VAGINAL HYSTERECTOMY     LIPOMA EXCISION     right breast   TONSILLECTOMY     WISDOM TOOTH EXTRACTION      CURRENT MEDICATIONS:   Current Outpatient Medications  Medication Sig Dispense Refill   hydroxychloroquine (PLAQUENIL) 200 MG tablet Take 1 tablet by mouth daily.     Cholecalciferol (VITAMIN D3) 50 MCG (2000 UT) capsule Take by mouth.     cyanocobalamin 1000 MCG tablet Take 1 tablet by mouth daily.     dexamethasone (DECADRON) 4 MG tablet Take 2 tablets by mouth once a day starting the day after chemotherapy. Continue for 3 days total. Take with food. 30 tablet 1   folic acid (FOLVITE) 1 MG tablet      methotrexate (RHEUMATREX) 2.5 MG tablet Takes on Monday weekly     metoprolol succinate (TOPROL-XL) 25 MG 24 hr tablet at bedtime.      ondansetron (ZOFRAN) 8 MG tablet Take 1 tablet (8 mg total) by mouth 2 (two) times daily as needed. Start on the third day after chemotherapy. 30 tablet 1   OVER THE COUNTER MEDICATION Take 1 capsule by mouth 3 (three) times a week. beta-carotene,A,-vits C,E/mins (OCUVITE ORAL)     prochlorperazine (COMPAZINE) 10 MG tablet Take 1 tablet (10 mg total) by mouth every 6 (six) hours as needed (Nausea or vomiting). 30 tablet 1  No current facility-administered medications for this visit.    ALLERGIES:   Allergies  Allergen Reactions   Sulfamethoxazole Itching and Rash    FAMILY HISTORY:   Family History  Problem Relation Age of Onset   Colon cancer Neg Hx    Rectal cancer Neg Hx    Stomach cancer Neg Hx    Colon polyps Neg Hx     SOCIAL HISTORY:  The patient was born and raised in [place].  She lives in [place] with her husband of 13 years.  She has no children.  She is a retired Printmaker.  There is no history of alcoholism or tobacco abuse.  REVIEW OF SYSTEMS:  Review of Systems  Constitutional:  Negative for fatigue and fever.  HENT:   Negative for hearing loss and sore  throat.   Eyes:  Negative for eye problems.  Respiratory:  Negative for chest tightness, cough and hemoptysis.   Cardiovascular:  Negative for chest pain and palpitations.  Gastrointestinal:  Negative for abdominal distention, abdominal pain, blood in stool, constipation, diarrhea, nausea and vomiting.  Endocrine: Negative for hot flashes.  Genitourinary:  Negative for difficulty urinating, dysuria, frequency, hematuria and nocturia.   Musculoskeletal:  Negative for arthralgias, back pain, gait problem and myalgias.  Skin: Negative.  Negative for itching and rash.  Neurological: Negative.  Negative for dizziness, extremity weakness, gait problem, headaches, light-headedness and numbness.  Hematological: Negative.   Psychiatric/Behavioral: Negative.  Negative for depression and suicidal ideas. The patient is not nervous/anxious.     PHYSICAL EXAM:  Blood pressure (!) 161/73, pulse 70, temperature 98.6 F (37 C), resp. rate 14, height 5\' 7"  (1.702 m), weight 137 lb 9.6 oz (62.4 kg), SpO2 97 %. Wt Readings from Last 3 Encounters:  02/17/22 134 lb 14.4 oz (61.2 kg)  02/15/22 137 lb 9.6 oz (62.4 kg)  06/24/19 132 lb (59.9 kg)   Body mass index is 21.55 kg/m. Performance status (ECOG): 0 - Asymptomatic Physical Exam Constitutional:      Appearance: Normal appearance.  HENT:     Mouth/Throat:     Pharynx: Oropharynx is clear. No oropharyngeal exudate.  Cardiovascular:     Rate and Rhythm: Normal rate and regular rhythm.     Heart sounds: No murmur heard.   No friction rub. No gallop.  Pulmonary:     Breath sounds: Normal breath sounds.  Chest:  Breasts:    Right: Normal. No swelling, bleeding, inverted nipple, mass, nipple discharge or skin change.     Left: Inverted nipple and mass (lesion spans 9 cm) present. No swelling, bleeding, nipple discharge or skin change.  Abdominal:     General: Bowel sounds are normal. There is no distension.     Palpations: Abdomen is soft. There is  no mass.     Tenderness: There is no abdominal tenderness.  Musculoskeletal:        General: No tenderness.     Cervical back: Normal range of motion and neck supple.     Right lower leg: No edema.     Left lower leg: No edema.  Lymphadenopathy:     Cervical: No cervical adenopathy.     Right cervical: No superficial, deep or posterior cervical adenopathy.    Left cervical: No superficial, deep or posterior cervical adenopathy.     Upper Body:     Right upper body: No supraclavicular or axillary adenopathy.     Left upper body: Axillary adenopathy (minimally palpalble at <1 cm)  present. No supraclavicular adenopathy.     Lower Body: No right inguinal adenopathy. No left inguinal adenopathy.  Skin:    Coloration: Skin is not jaundiced.     Findings: No lesion or rash.  Neurological:     General: No focal deficit present.     Mental Status: She is alert and oriented to person, place, and time. Mental status is at baseline.  Psychiatric:        Mood and Affect: Mood normal.        Behavior: Behavior normal.        Thought Content: Thought content normal.        Judgment: Judgment normal.    LABS:   CBC Latest Ref Rng & Units 02/15/2022 06/26/2007  WBC - 6.4 5.3  Hemoglobin 12.0 - 16.0 13.8 13.7  Hematocrit 36 - 46 42 39.8  Platelets 150 - 400 K/uL 348 435(H)   CMP Latest Ref Rng & Units 02/15/2022 06/26/2007  Glucose - - 99  BUN 4 - 21 15 10   Creatinine 0.5 - 1.1 0.7 0.68  Sodium 137 - 147 140 141  Potassium 3.5 - 5.1 mEq/L 3.9 4.0  Chloride 99 - 108 103 107  CO2 13 - 22 31(A) 27  Calcium 8.7 - 10.7 9.3 9.1  Alkaline Phos 25 - 125 66 -  AST 13 - 35 27 -  ALT 7 - 35 U/L 23 -   STUDIES:  MR BREAST BILATERAL W WO CONTRAST INC CAD  Result Date: 02/04/2022 CLINICAL DATA:  Left breast swelling since December 2022. Recently diagnosed left breast cancer. No abnormal lymph nodes seen in the left axilla on the December 07, 2021 diagnostic mammogram. Prior right breast lumpectomy for  lipoma 1999. EXAM: BILATERAL BREAST MRI WITH AND WITHOUT CONTRAST TECHNIQUE: Multiplanar, multisequence MR images of both breasts were obtained prior to and following the intravenous administration of 6 ml of Gadavist Three-dimensional MR images were rendered by post-processing of the original MR data on an independent workstation. The three-dimensional MR images were interpreted, and findings are reported in the following complete MRI report for this study. Three dimensional images were evaluated at the independent interpreting workstation using the DynaCAD thin client. COMPARISON:  Mammography January 03, 2022 FINDINGS: Breast composition: c. Heterogeneous fibroglandular tissue. Background parenchymal enhancement: Minimal Right breast: There is linear enhancement in the right central breast at the level of the nipple spanning 8.5 mm seen on series 10, images 72 through 76. No other abnormalities are identified in the right breast. Left breast: The patient's known malignancy is much larger than appreciated on mammogram and ultrasound. The malignancy encompasses nearly the entire upper inner and outer quadrants. The abnormal enhancement associated with the malignancy also extends inferior to the nipple into the lower inner and outer quadrants. The malignancy abuts the posterior aspect of the nipple with nipple inversion. There is mild associated skin thickening on the left. The malignancy measures 8.7 by 5.0 cm in transverse and AP dimensions. The malignancy measures up to 9.8 cm in cranial caudal dimension. Lymph nodes: Despite the normal ultrasound at the time of diagnostic mammography, there appears to be at least 1 abnormal node in the left axilla as seen on series 4, image 36 with cortex measuring up the 7.3 mm. Overall, the lymph nodes on the left are larger than those on the right. Ancillary findings:  None. IMPRESSION: 1. 8.5 mm of linear enhancement in the right central breast on series 10, images 72-76. 2.  The patient's  known malignancy on the left measures at least 8.7 x 5.0 x 9.8 cm in transverse, AP, and craniocaudal dimensions. Nearly the entirety of the upper inner and outer quadrants is involved. A smaller amount of the lower inner and outer quadrants is involved. There is associated nipple inversion and skin thickening. 3. There is a mildly abnormal lymph node with a cortex measuring 7 mm in the left axilla not appreciated on the January 03, 2022 diagnostic mammogram and ultrasound. RECOMMENDATION: 1. Recommend second-look ultrasound in the left axilla. If the mildly abnormal node seen on today's study is identified, ultrasound-guided biopsy is recommended. 2. Recommend MRI guided biopsy of the linear enhancement in the right breast. 3. Based on the MRI, it appears the patient will need a left mastectomy rather than lumpectomy. If breast conservation is being considered, 2 biopsies could be obtained on the left to prove extent of disease. BI-RADS CATEGORY  4: Suspicious. Electronically Signed   By: Gerome Sam III M.D.   On: 02/04/2022 17:29  MM CLIP PLACEMENT RIGHT  Result Date: 02/11/2022 CLINICAL DATA:  Status post MR guided core biopsy of non mass enhancement in the RIGHT breast. EXAM: 3D DIAGNOSTIC RIGHT MAMMOGRAM POST MRI BIOPSY COMPARISON:  Previous exam(s). FINDINGS: 3D Mammographic images were obtained following MRI guided biopsy of non mass enhancement in the UPPER central RIGHT breast and placement of a barbell clip. The biopsy marking clip is in expected position at the site of biopsy. IMPRESSION: Appropriate positioning of the barbell shaped biopsy marking clip at the site of biopsy in the UPPER central RIGHT breast. Final Assessment: Post Procedure Mammograms for Marker Placement Electronically Signed   By: Norva Pavlov M.D.   On: 02/11/2022 11:15  MR RT BREAST BX W LOC DEV 1ST LESION IMAGE BX SPEC MR GUIDE  Addendum Date: 02/14/2022   ADDENDUM REPORT: 02/14/2022 13:51 ADDENDUM:  Pathology revealed BENIGN BREAST TISSUE WITH COLUMNAR CELL CHANGES AND INCREASED STROMAL FIBROSIS- NO MALIGNANCY IDENTIFIED of the RIGHT breast, upper central (barbell clip). This was found to be concordant by Dr. Norva Pavlov. Pathology results were discussed with the patient by telephone. The patient reported doing well after the biopsy with tenderness and bruising at the site. Post biopsy instructions and care were reviewed and questions were answered. The patient was encouraged to call [facility] for any additional concerns. The patient has a recent diagnosis of LEFT breast cancer and should follow her outlined treatment plan. Pathology results reported by Collene Mares RN on 02/14/2022. Electronically Signed   By: Norva Pavlov M.D.   On: 02/14/2022 13:51   Result Date: 02/14/2022 CLINICAL DATA:  Patient presents for MR guided core biopsy of non mass enhancement in the RIGHT breast. Recent diagnosis of LEFT breast malignancy. EXAM: MRI GUIDED CORE NEEDLE BIOPSY OF THE RIGHT BREAST TECHNIQUE: Multiplanar, multisequence MR imaging of the RIGHT breast was performed both before and after administration of intravenous contrast. CONTRAST:  Six COMPARISON:  Previous exams. FINDINGS: I met with the patient, and we discussed the procedure of MRI guided biopsy, including risks, benefits, and alternatives. Specifically, we discussed the risks of infection, bleeding, tissue injury, clip migration, and inadequate sampling. Informed, written consent was given. The usual time out protocol was performed immediately prior to the procedure. Using sterile technique, 1% Lidocaine, MRI guidance, and a 9 gauge vacuum assisted device, biopsy was performed of non mass enhancement in the UPPER anterior RIGHT breast using a LATERAL to MEDIAL approach. At the conclusion of  the procedure, a barbell tissue marker clip was deployed into the biopsy cavity. Follow-up 2-view mammogram was performed and  dictated separately. IMPRESSION: MRI guided biopsy of RIGHT mass non mass enhancement. No apparent complications. Electronically Signed: By: Norva Pavlov M.D. On: 02/11/2022 11:14    ASSESSMENT & PLAN:  A 66 y.o. female who I was asked to consult upon for newly diagnosed stage IIIA hormone positive breast cancer.  In clinic today, I went over all of her imaging and biopsy results with her, for which she understands the seriousness of her diagnosis.  I do believe this patient would be best served with neoadjuvant chemotherapy, followed by surgery and radiation to treat her disease.  I will start her on dose dense chemotherapy consisting of 4 cycles of Adriamycin/Cytoxan, followed 4 cycles of paclitaxel.  The patient was made aware of the side effects that can go along with this chemotherapy regimen, including alopecia, fatigue, cytopenias, and peripheral neuropathy.  The patient will receive white cell shot therapy after each cycle of dose dense chemotherapy to prevent neutropenia from delaying future cycles of treatment.  She will also undergo port placement through which all of her chemotherapy will be given.  I anticipate her first cycle of chemotherapy to be given within the next 2 weeks.  I will see her back 2 weeks later before she heads into her 2nd cycle of dose dense Adriamycin/Cytoxan.  The patient understands all the plans discussed today and is in agreement with them.   I do appreciate Goins, Gwenith Spitz, FNP for his new consult.   Allin Frix Kirby Funk, MD

## 2022-02-21 ENCOUNTER — Encounter: Payer: Self-pay | Admitting: Oncology

## 2022-02-22 ENCOUNTER — Other Ambulatory Visit: Payer: Self-pay

## 2022-02-22 ENCOUNTER — Encounter: Payer: Self-pay | Admitting: Oncology

## 2022-02-22 ENCOUNTER — Ambulatory Visit (INDEPENDENT_AMBULATORY_CARE_PROVIDER_SITE_OTHER): Payer: Managed Care, Other (non HMO)

## 2022-02-22 DIAGNOSIS — I361 Nonrheumatic tricuspid (valve) insufficiency: Secondary | ICD-10-CM | POA: Diagnosis not present

## 2022-02-22 DIAGNOSIS — C50112 Malignant neoplasm of central portion of left female breast: Secondary | ICD-10-CM | POA: Diagnosis not present

## 2022-02-22 DIAGNOSIS — Z170 Estrogen receptor positive status [ER+]: Secondary | ICD-10-CM | POA: Diagnosis not present

## 2022-02-22 DIAGNOSIS — I5030 Unspecified diastolic (congestive) heart failure: Secondary | ICD-10-CM | POA: Diagnosis not present

## 2022-02-22 LAB — ECHOCARDIOGRAM COMPLETE
Area-P 1/2: 2.91 cm2
S' Lateral: 2.7 cm

## 2022-02-22 NOTE — Progress Notes (Unsigned)
? ?  No PA required for [address] ECHO ? ?Reference Number #[id number] Copy Number ? ?Created NB:ZXYDS Alcario Drought on 02/22/2022 at 01:21 PM ? ? ?

## 2022-02-24 ENCOUNTER — Telehealth: Payer: Self-pay

## 2022-02-24 NOTE — Telephone Encounter (Signed)
Per Lenna Sciara, NP.  Patient can take Claritin '10mg'$  ER and the spouse is not allowed to sit with patient. ?

## 2022-02-28 ENCOUNTER — Inpatient Hospital Stay: Payer: Managed Care, Other (non HMO)

## 2022-02-28 ENCOUNTER — Other Ambulatory Visit: Payer: Self-pay

## 2022-02-28 DIAGNOSIS — C50112 Malignant neoplasm of central portion of left female breast: Secondary | ICD-10-CM

## 2022-02-28 LAB — BASIC METABOLIC PANEL
BUN: 15 (ref 4–21)
CO2: 33 — AB (ref 13–22)
Chloride: 103 (ref 99–108)
Creatinine: 0.7 (ref 0.5–1.1)
Glucose: 93
Potassium: 4.3 mEq/L (ref 3.5–5.1)
Sodium: 139 (ref 137–147)

## 2022-02-28 LAB — HEPATIC FUNCTION PANEL
ALT: 20 U/L (ref 7–35)
AST: 25 (ref 13–35)
Alkaline Phosphatase: 58 (ref 25–125)
Bilirubin, Total: 0.6

## 2022-02-28 LAB — COMPREHENSIVE METABOLIC PANEL
Albumin: 4.2 (ref 3.5–5.0)
Calcium: 8.9 (ref 8.7–10.7)

## 2022-02-28 LAB — CBC AND DIFFERENTIAL
HCT: 43 (ref 36–46)
Hemoglobin: 14.1 (ref 12.0–16.0)
Neutrophils Absolute: 6
Platelets: 330 10*3/uL (ref 150–400)
WBC: 7.9

## 2022-02-28 LAB — CBC: RBC: 4.83 (ref 3.87–5.11)

## 2022-03-01 ENCOUNTER — Inpatient Hospital Stay: Payer: Managed Care, Other (non HMO)

## 2022-03-01 ENCOUNTER — Other Ambulatory Visit: Payer: Self-pay | Admitting: Hematology and Oncology

## 2022-03-01 VITALS — BP 156/76 | HR 63 | Temp 98.5°F | Resp 20 | Ht 66.93 in | Wt 136.1 lb

## 2022-03-01 DIAGNOSIS — Z5111 Encounter for antineoplastic chemotherapy: Secondary | ICD-10-CM | POA: Diagnosis present

## 2022-03-01 DIAGNOSIS — Z170 Estrogen receptor positive status [ER+]: Secondary | ICD-10-CM

## 2022-03-01 DIAGNOSIS — C50912 Malignant neoplasm of unspecified site of left female breast: Secondary | ICD-10-CM | POA: Diagnosis present

## 2022-03-01 MED ORDER — HEPARIN SOD (PORK) LOCK FLUSH 100 UNIT/ML IV SOLN
500.0000 [IU] | Freq: Once | INTRAVENOUS | Status: AC | PRN
  Administered 2022-03-01: 500 [IU]

## 2022-03-01 MED ORDER — FAMOTIDINE IN NACL 20-0.9 MG/50ML-% IV SOLN
20.0000 mg | Freq: Once | INTRAVENOUS | Status: AC
  Administered 2022-03-01: 20 mg via INTRAVENOUS
  Filled 2022-03-01: qty 50

## 2022-03-01 MED ORDER — OLANZAPINE 10 MG PO TABS: 10.0000 mg | ORAL_TABLET | Freq: Every day | ORAL | 3 refills | Status: DC

## 2022-03-01 MED ORDER — SODIUM CHLORIDE 0.9 % IV SOLN: Freq: Once | INTRAVENOUS | Status: AC

## 2022-03-01 MED ORDER — SODIUM CHLORIDE 0.9 % IV SOLN
600.0000 mg/m2 | Freq: Once | INTRAVENOUS | Status: AC
  Administered 2022-03-01: 1020 mg via INTRAVENOUS
  Filled 2022-03-01: qty 51

## 2022-03-01 MED ORDER — PROCHLORPERAZINE MALEATE 10 MG PO TABS
10.0000 mg | ORAL_TABLET | Freq: Once | ORAL | Status: AC
  Administered 2022-03-01: 10 mg via ORAL
  Filled 2022-03-01: qty 1

## 2022-03-01 MED ORDER — SODIUM CHLORIDE 0.9 % IV SOLN
10.0000 mg | Freq: Once | INTRAVENOUS | Status: AC
  Administered 2022-03-01: 10 mg via INTRAVENOUS
  Filled 2022-03-01: qty 10

## 2022-03-01 MED ORDER — DOXORUBICIN HCL CHEMO IV INJECTION 2 MG/ML
60.0000 mg/m2 | Freq: Once | INTRAVENOUS | Status: AC
  Administered 2022-03-01: 102 mg via INTRAVENOUS
  Filled 2022-03-01: qty 51

## 2022-03-01 MED ORDER — PALONOSETRON HCL INJECTION 0.25 MG/5ML
0.2500 mg | Freq: Once | INTRAVENOUS | Status: AC
  Administered 2022-03-01: 0.25 mg via INTRAVENOUS
  Filled 2022-03-01: qty 5

## 2022-03-01 MED ORDER — PROCHLORPERAZINE EDISYLATE 10 MG/2ML IJ SOLN: 10.0000 mg | Freq: Once | INTRAMUSCULAR | Status: DC

## 2022-03-01 MED ORDER — SODIUM CHLORIDE 0.9% FLUSH
10.0000 mL | INTRAVENOUS | Status: DC | PRN
  Administered 2022-03-01: 10 mL

## 2022-03-01 MED ORDER — SODIUM CHLORIDE 0.9 % IV SOLN
150.0000 mg | Freq: Once | INTRAVENOUS | Status: AC
  Administered 2022-03-01: 150 mg via INTRAVENOUS
  Filled 2022-03-01: qty 150

## 2022-03-01 NOTE — Patient Instructions (Addendum)
[facility]  Discharge Instructions: ?Thank you for choosing [facility] to provide your oncology and hematology care.  ?If you have a lab appointment with the [facility], please go directly to the [facility] and check in at the registration area. ?  ?Wear comfortable clothing and clothing appropriate for easy access to any Portacath or PICC line.  ? ?We strive to give you quality time with your provider. You may need to reschedule your appointment if you arrive late (15 or more minutes).  Arriving late affects you and other patients whose appointments are after yours.  Also, if you miss three or more appointments without notifying the office, you may be dismissed from the clinic at the provider?s discretion.    ?  ?For prescription refill requests, have your pharmacy contact our office and allow 72 hours for refills to be completed.   ? ?Today you received the following chemotherapy and/or immunotherapy agents:Aloxi- for Nausea control, Compazine- for nausea control, Pepcid- for nausea and to prevent reaction, Dexamethasone- Steroid for nausea, and to prevent reaction.  ? ?Chemotherapy included- Adriamycin and Cyclophosphamide. ?  ?To help prevent nausea and vomiting after your treatment, we encourage you to take your nausea medication as directed. ? ?BELOW ARE SYMPTOMS THAT SHOULD BE REPORTED IMMEDIATELY: ?*FEVER GREATER THAN 100.4 F (38 ?C) OR HIGHER ?*CHILLS OR SWEATING ?*NAUSEA AND VOMITING THAT IS NOT CONTROLLED WITH YOUR NAUSEA MEDICATION ?*UNUSUAL SHORTNESS OF BREATH ?*UNUSUAL BRUISING OR BLEEDING ?*URINARY PROBLEMS (pain or burning when urinating, or frequent urination) ?*BOWEL PROBLEMS (unusual diarrhea, constipation, pain near the anus) ?TENDERNESS IN MOUTH AND THROAT WITH OR WITHOUT PRESENCE OF ULCERS (sore throat, sores in mouth, or a toothache) ?UNUSUAL RASH, SWELLING OR PAIN  ?UNUSUAL VAGINAL DISCHARGE OR ITCHING  ? ?Items with * indicate a potential  emergency and should be followed up as soon as possible or go to the Emergency Department if any problems should occur. ? ?Should you have questions after your visit or need to cancel or reschedule your appointment, please contact [facility]  Dept: [phone]  and follow the prompts.  Office hours are 8:00 a.m. to 4:30 p.m. Monday - Friday. Please note that voicemails left after 4:00 p.m. may not be returned until the following business day.  We are closed weekends and major holidays. You have access to a nurse at all times for urgent questions. Please call the main number to the clinic Dept: [phone] and follow the prompts. ? ?For any non-urgent questions, you may also contact your provider using MyChart. We now offer e-Visits for anyone 15 and older to request care online for non-urgent symptoms. For details visit [email or web address]. ?  ?Also download the MyChart app! Go to the app store, search "MyChart", open the app, select [facility], and log in with your MyChart username and password. ? ?Cyclophosphamide Injection ?What is this medication? ?CYCLOPHOSPHAMIDE (sye kloe FOSS fa mide) is a chemotherapy drug. It slows the growth of cancer cells. This medicine is used to treat many types of cancer like lymphoma, myeloma, leukemia, breast cancer, and ovarian cancer, to name a few. ?This medicine may be used for other purposes; ask your health care provider or pharmacist if you have questions. ?COMMON BRAND NAME(S): Cytoxan, Neosar ?What should I tell my care team before I take this medication? ?They need to know if you have any of these conditions: ?heart disease ?history of irregular heartbeat ?infection ?kidney disease ?liver disease ?low blood counts,  like white cells, platelets, or red blood cells ?on hemodialysis ?recent or ongoing radiation therapy ?scarring or thickening of the lungs ?trouble passing urine ?an unusual or allergic reaction to cyclophosphamide, other  medicines, foods, dyes, or preservatives ?pregnant or trying to get pregnant ?breast-feeding ?How should I use this medication? ?This drug is usually given as an injection into a vein or muscle or by infusion into a vein. It is administered in a hospital or clinic by a specially trained health care professional. ?Talk to your pediatrician regarding the use of this medicine in children. Special care may be needed. ?Overdosage: If you think you have taken too much of this medicine contact a poison control center or emergency room at once. ?NOTE: This medicine is only for you. Do not share this medicine with others. ?What if I miss a dose? ?It is important not to miss your dose. Call your doctor or health care professional if you are unable to keep an appointment. ?What may interact with this medication? ?amphotericin B ?azathioprine ?certain antivirals for HIV or hepatitis ?certain medicines for blood pressure, heart disease, irregular heart beat ?certain medicines that treat or prevent blood clots like warfarin ?certain other medicines for cancer ?cyclosporine ?etanercept ?indomethacin ?medicines that relax muscles for surgery ?medicines to increase blood counts ?metronidazole ?This list may not describe all possible interactions. Give your health care provider a list of all the medicines, herbs, non-prescription drugs, or dietary supplements you use. Also tell them if you smoke, drink alcohol, or use illegal drugs. Some items may interact with your medicine. ?What should I watch for while using this medication? ?Your condition will be monitored carefully while you are receiving this medicine. ?You may need blood work done while you are taking this medicine. ?Drink water or other fluids as directed. Urinate often, even at night. ?Some products may contain alcohol. Ask your health care professional if this medicine contains alcohol. Be sure to tell all health care professionals you are taking this medicine. Certain  medicines, like metronidazole and disulfiram, can cause an unpleasant reaction when taken with alcohol. The reaction includes flushing, headache, nausea, vomiting, sweating, and increased thirst. The reaction can last from 30 minutes to several hours. ?Do not become pregnant while taking this medicine or for 1 year after stopping it. Women should inform their health care professional if they wish to become pregnant or think they might be pregnant. Men should not father a child while taking this medicine and for 4 months after stopping it. There is potential for serious side effects to an unborn child. Talk to your health care professional for more information. ?Do not breast-feed an infant while taking this medicine or for 1 week after stopping it. ?This medicine has caused ovarian failure in some women. This medicine may make it more difficult to get pregnant. Talk to your health care professional if you are concerned about your fertility. ?This medicine has caused decreased sperm counts in some men. This may make it more difficult to father a child. Talk to your health care professional if you are concerned about your fertility. ?Call your health care professional for advice if you get a fever, chills, or sore throat, or other symptoms of a cold or flu. Do not treat yourself. This medicine decreases your body's ability to fight infections. Try to avoid being around people who are sick. ?Avoid taking medicines that contain aspirin, acetaminophen, ibuprofen, naproxen, or ketoprofen unless instructed by your health care professional. These medicines may hide a fever. ?  Talk to your health care professional about your risk of cancer. You may be more at risk for certain types of cancer if you take this medicine. ?If you are going to need surgery or other procedure, tell your health care professional that you are using this medicine. ?Be careful brushing or flossing your teeth or using a toothpick because you may get an  infection or bleed more easily. If you have any dental work done, tell your dentist you are receiving this medicine. ?What side effects may I notice from receiving this medication? ?Side effects that you should re

## 2022-03-01 NOTE — Progress Notes (Signed)
Hypersensitivity Reaction note ? ?Date of event: 03/01/22 ?Time of event: 0937 ?Generic name of drug involved: Emend (Fosaprepitant) ?Name of provider notified of the hypersensitivity reaction: Lewis  ?Was agent that likely caused hypersensitivity reaction added to Allergies List within EMR? Yes ?Chain of events including reaction signs/symptoms, treatment administered, and outcome (e.g., drug resumed; drug discontinued; sent to Emergency Department; etc.) Emend started at 0930 at [phone] patient became flushed, neck and chest area red. Emend stopped, NS and line stopped- New NS and line hung. [name] Pharm D notified and at bedside. Flushing resolved after med stopped. Patient denies and shortness of breath, nausea or itching.  ? ?Alexandra Marion, RN ?03/01/2022 10:00 AM ? ?

## 2022-03-01 NOTE — Addendum Note (Signed)
Addended by: Neysa Hotter on: 03/01/2022 02:15 PM ? ? Modules accepted: Orders ? ?

## 2022-03-01 NOTE — Progress Notes (Addendum)
Hypersensitivity reaction to Emend. Emend stopped and not resumed. Will increase Dex premedication to '20mg'$  IV (Dex '10mg'$  IV + '10mg'$  IV) today and include Pepcid '20mg'$  IV and Compazine '10mg'$  po.  Melissa, NP sent Olanzapine 10 mg po QHS x 4 doses to begin tonight. ? ?Acquanetta Belling, [place], BCPS, BCOP ?03/01/2022 ?3:40 PM ? ?

## 2022-03-02 ENCOUNTER — Other Ambulatory Visit: Payer: Self-pay | Admitting: Pharmacist

## 2022-03-02 ENCOUNTER — Telehealth: Payer: Self-pay

## 2022-03-02 ENCOUNTER — Encounter (HOSPITAL_COMMUNITY): Payer: Self-pay

## 2022-03-02 NOTE — Telephone Encounter (Signed)
I spoke with pt to see how she was doing today. She had her 1st chemo infusion yesterday. She did have a hypersensitivity reaction to Emend, which they treated and her symptoms subsided. Pt did not require emergency room visit. Pt denies N/V, rash/itching, diarrhea, SOB, and fevers. She reports that "her head feels funny. I don't know if it is from the allergy medication I took or what". I told her that it could be related to the steroids or one of the medications given. It is not intolerable for pt. Pt reminded to call us if she develops temp of 100.4 or higher. She verbalized understanding. I asked if she had Claritin to take, once she gets her WBC injection tomorrow. She does have it in the home and knows to take it for a minimum of 5 days after injection to hopefully minimize bone pain. ?

## 2022-03-03 ENCOUNTER — Encounter: Payer: Self-pay | Admitting: Oncology

## 2022-03-03 ENCOUNTER — Inpatient Hospital Stay: Payer: Managed Care, Other (non HMO)

## 2022-03-03 VITALS — BP 169/88 | HR 67 | Temp 98.2°F | Resp 20 | Wt 137.1 lb

## 2022-03-03 DIAGNOSIS — C50112 Malignant neoplasm of central portion of left female breast: Secondary | ICD-10-CM

## 2022-03-03 DIAGNOSIS — Z5111 Encounter for antineoplastic chemotherapy: Secondary | ICD-10-CM | POA: Diagnosis not present

## 2022-03-03 MED ORDER — PEGFILGRASTIM-BMEZ 6 MG/0.6ML ~~LOC~~ SOSY
6.0000 mg | PREFILLED_SYRINGE | Freq: Once | SUBCUTANEOUS | Status: AC
  Administered 2022-03-03: 6 mg via SUBCUTANEOUS
  Filled 2022-03-03: qty 0.6

## 2022-03-03 NOTE — Patient Instructions (Signed)

## 2022-03-08 ENCOUNTER — Other Ambulatory Visit: Payer: Managed Care, Other (non HMO)

## 2022-03-08 ENCOUNTER — Ambulatory Visit: Payer: Managed Care, Other (non HMO) | Admitting: Oncology

## 2022-03-10 ENCOUNTER — Telehealth: Payer: Self-pay

## 2022-03-10 NOTE — Telephone Encounter (Signed)
Called patient, she denies N,V,C,D, chills or any other symptoms.  She will see [place] tomorrow 03/11/2022. ? ?

## 2022-03-10 NOTE — Telephone Encounter (Signed)
-----   Message from Melodye Ped, NP sent at 03/10/2022  9:49 AM EDT ----- ?Regarding: RE: Fever ?If no symptoms, we will just watch and see him in the morning.  ?----- Message ----- ?From: Georgette Shell, RN ?Sent: 03/10/2022   9:23 AM EDT ?To: Melodye Ped, NP ?Subject: Fever                                         ? ?Pt called stating she had elevated temperature the past two evenings 99.3 and 100.3.  Normal in the morning.  She has an appt with Dr. Bobby Rumpf 03/11/22.   ? ? ?

## 2022-03-11 ENCOUNTER — Inpatient Hospital Stay: Payer: Managed Care, Other (non HMO) | Admitting: Oncology

## 2022-03-11 ENCOUNTER — Inpatient Hospital Stay: Payer: Managed Care, Other (non HMO) | Attending: Oncology

## 2022-03-11 VITALS — BP 134/60 | HR 91 | Temp 98.9°F | Resp 14 | Ht 66.25 in | Wt 134.6 lb

## 2022-03-11 DIAGNOSIS — C50112 Malignant neoplasm of central portion of left female breast: Secondary | ICD-10-CM

## 2022-03-11 DIAGNOSIS — Z170 Estrogen receptor positive status [ER+]: Secondary | ICD-10-CM

## 2022-03-11 DIAGNOSIS — C50912 Malignant neoplasm of unspecified site of left female breast: Secondary | ICD-10-CM | POA: Insufficient documentation

## 2022-03-11 DIAGNOSIS — Z5111 Encounter for antineoplastic chemotherapy: Secondary | ICD-10-CM | POA: Insufficient documentation

## 2022-03-11 LAB — CBC: RBC: 4.26 (ref 3.87–5.11)

## 2022-03-11 LAB — BASIC METABOLIC PANEL
BUN: 12 (ref 4–21)
CO2: 30 — AB (ref 13–22)
Chloride: 102 (ref 99–108)
Creatinine: 0.7 (ref 0.5–1.1)
Glucose: 109
Potassium: 3.9 mEq/L (ref 3.5–5.1)
Sodium: 139 (ref 137–147)

## 2022-03-11 LAB — CBC AND DIFFERENTIAL
HCT: 38 (ref 36–46)
Hemoglobin: 12.4 (ref 12.0–16.0)
Neutrophils Absolute: 6.36
Platelets: 114 10*3/uL — AB (ref 150–400)
WBC: 8.6

## 2022-03-11 LAB — HEPATIC FUNCTION PANEL
ALT: 17 U/L (ref 7–35)
AST: 22 (ref 13–35)
Alkaline Phosphatase: 84 (ref 25–125)
Bilirubin, Total: 0.4

## 2022-03-11 LAB — COMPREHENSIVE METABOLIC PANEL
Albumin: 4 (ref 3.5–5.0)
Calcium: 8.9 (ref 8.7–10.7)

## 2022-03-11 NOTE — Progress Notes (Signed)
Face to face contact with pt and her husband in exam room, Pt is here for follow up after her first chemo.  Pt reports that she did well with her chemo. We discussed that her hair will start letting go soon and that itmight be easier for her buzz her hair and not watch if fall out piece by piece. Encourage pt to call with questions or concerns. ? ?

## 2022-03-11 NOTE — Progress Notes (Signed)
?[facility]  ?[address] ?[place],  [place]  [address] ?[phone] ? ?Clinic Day:  03/11/2022 ? ?Referring physician: Gweneth Fritter, FNP ? ? ?HISTORY OF PRESENT ILLNESS:  ?The patient is a 66 y.o. female who I recently began seeing for stage IIIA hormone positive breast cancer.  The patient is currently undergoing neoadjuvant chemotherapy.  She comes in today to be evaluated before heading into her 2nd cycle of dose dense Adriamycin/Cytoxan.  The patient claims of tolerated her first cycle of dose dense chemotherapy very well.  She denies having any significant side effects which impact her daily quality of life.  She is pleased that she has noticed an improvement in her left breast after just 1 cycle of treatment.   ? ?PHYSICAL EXAM:  ?Blood pressure 134/60, pulse 91, temperature 98.9 ?F (37.2 ?C), resp. rate 14, height 5' 6.25" (1.683 m), weight 134 lb 9.6 oz (61.1 kg), SpO2 99 %. ?Wt Readings from Last 3 Encounters:  ?03/11/22 134 lb 9.6 oz (61.1 kg)  ?03/03/22 137 lb 1.9 oz (62.2 kg)  ?03/01/22 136 lb 1.9 oz (61.7 kg)  ? ?Body mass index is 21.56 kg/m?Marland Kitchen ?Performance status (ECOG): 0 - Asymptomatic ?Physical Exam ?Constitutional:   ?   Appearance: Normal appearance.  ?HENT:  ?   Mouth/Throat:  ?   Pharynx: Oropharynx is clear. No oropharyngeal exudate.  ?Cardiovascular:  ?   Rate and Rhythm: Normal rate and regular rhythm.  ?   Heart sounds: No murmur heard. ?  No friction rub. No gallop.  ?Pulmonary:  ?   Breath sounds: Normal breath sounds.  ?Chest:  ?Breasts: ?   Right: Normal. No swelling, bleeding, inverted nipple, mass, nipple discharge or skin change.  ?   Left: Inverted nipple (less nipple inversion seen) and mass (decreased in size to 6-6.5 cm) present. No swelling, bleeding, nipple discharge or skin change.  ?Abdominal:  ?   General: Bowel sounds are normal. There is no distension.  ?   Palpations: Abdomen is soft. There is no mass.  ?   Tenderness: There is no  abdominal tenderness.  ?Musculoskeletal:     ?   General: No tenderness.  ?   Cervical back: Normal range of motion and neck supple.  ?   Right lower leg: No edema.  ?   Left lower leg: No edema.  ?Lymphadenopathy:  ?   Cervical: No cervical adenopathy.  ?   Right cervical: No superficial, deep or posterior cervical adenopathy. ?   Left cervical: No superficial, deep or posterior cervical adenopathy.  ?   Upper Body:  ?   Right upper body: No supraclavicular or axillary adenopathy.  ?   Left upper body: No supraclavicular or axillary (no longer palpable) adenopathy.  ?   Lower Body: No right inguinal adenopathy. No left inguinal adenopathy.  ?Skin: ?   Coloration: Skin is not jaundiced.  ?   Findings: No lesion or rash.  ?Neurological:  ?   General: No focal deficit present.  ?   Mental Status: She is alert and oriented to person, place, and time. Mental status is at baseline.  ?Psychiatric:     ?   Mood and Affect: Mood normal.     ?   Behavior: Behavior normal.     ?   Thought Content: Thought content normal.     ?   Judgment: Judgment normal.  ? ? ?LABS:  ? ? ?  Latest Ref Rng &  Units 03/11/2022  ? 12:00 AM 02/28/2022  ? 12:00 AM 02/15/2022  ? 12:00 AM  ?CBC  ?WBC  8.6      7.9      6.4       ?Hemoglobin 12.0 - 16.0 12.4      14.1      13.8       ?Hematocrit 36 - 46 38      43      42       ?Platelets 150 - 400 K/uL 114      330      348       ?  ? This result is from an external source.  ? ? ?  Latest Ref Rng & Units 03/11/2022  ? 12:00 AM 02/28/2022  ? 12:00 AM 02/15/2022  ? 12:00 AM  ?CMP  ?BUN 4 - '21 12      15      15       '$ ?Creatinine 0.5 - 1.1 0.7      0.7      0.7       ?Sodium 137 - 147 139      139      140       ?Potassium 3.5 - 5.1 mEq/L 3.9      4.3      3.9       ?Chloride 99 - 108 102      103      103       ?CO2 13 - 22 30      33      31       ?Calcium 8.7 - 10.7 8.9      8.9      9.3       ?Alkaline Phos 25 - 125 84      58      66       ?AST 13 - 35 '22      25      27       '$ ?ALT 7 - 35 U/L '17      20       23       '$ ?  ? This result is from an external source.  ? ?ASSESSMENT & PLAN:  ?A 66 y.o. female with stage IIIA hormone positive breast cancer.  Per her physical exam today, I already appreciated an improvement in her disease after her 1st cycle of dose dense chemotherapy.  She will proceed with her second cycle of dose dense Adriamycin/Cytoxan early next week.  She will continue to receive Neulasta after each cycle of treatment to prevent severe neutropenia from causing any complications.  Clinically, the patient is doing very well.  I will see her back in 2 weeks before she has into her 3rd cycle of dose dense Adriamycin/Cytoxan.  The patient understands all the plans discussed today and is in agreement with them. ?  ?Berenize Gatlin Macarthur Critchley, MD   ? ? ?  ?

## 2022-03-11 NOTE — Progress Notes (Signed)
Pt states she has had low grade fever (100.3) for last three nights. ?

## 2022-03-14 ENCOUNTER — Encounter: Payer: Self-pay | Admitting: Oncology

## 2022-03-15 ENCOUNTER — Inpatient Hospital Stay: Payer: Managed Care, Other (non HMO)

## 2022-03-15 VITALS — BP 138/61 | HR 70 | Temp 98.3°F | Resp 18 | Ht 66.25 in | Wt 136.0 lb

## 2022-03-15 DIAGNOSIS — Z5111 Encounter for antineoplastic chemotherapy: Secondary | ICD-10-CM | POA: Diagnosis not present

## 2022-03-15 DIAGNOSIS — Z170 Estrogen receptor positive status [ER+]: Secondary | ICD-10-CM

## 2022-03-15 DIAGNOSIS — C50912 Malignant neoplasm of unspecified site of left female breast: Secondary | ICD-10-CM | POA: Diagnosis present

## 2022-03-15 MED ORDER — DOXORUBICIN HCL CHEMO IV INJECTION 2 MG/ML
60.0000 mg/m2 | Freq: Once | INTRAVENOUS | Status: AC
  Administered 2022-03-15: 102 mg via INTRAVENOUS
  Filled 2022-03-15: qty 51

## 2022-03-15 MED ORDER — HEPARIN SOD (PORK) LOCK FLUSH 100 UNIT/ML IV SOLN
500.0000 [IU] | Freq: Once | INTRAVENOUS | Status: AC | PRN
  Administered 2022-03-15: 500 [IU]

## 2022-03-15 MED ORDER — SODIUM CHLORIDE 0.9 % IV SOLN
10.0000 mg | Freq: Once | INTRAVENOUS | Status: AC
  Administered 2022-03-15: 10 mg via INTRAVENOUS
  Filled 2022-03-15: qty 10

## 2022-03-15 MED ORDER — PROCHLORPERAZINE MALEATE 10 MG PO TABS
10.0000 mg | ORAL_TABLET | Freq: Once | ORAL | Status: AC
  Administered 2022-03-15: 10 mg via ORAL
  Filled 2022-03-15: qty 1

## 2022-03-15 MED ORDER — PALONOSETRON HCL INJECTION 0.25 MG/5ML
0.2500 mg | Freq: Once | INTRAVENOUS | Status: AC
  Administered 2022-03-15: 0.25 mg via INTRAVENOUS
  Filled 2022-03-15: qty 5

## 2022-03-15 MED ORDER — SODIUM CHLORIDE 0.9% FLUSH
10.0000 mL | INTRAVENOUS | Status: DC | PRN
  Administered 2022-03-15: 10 mL

## 2022-03-15 MED ORDER — SODIUM CHLORIDE 0.9 % IV SOLN
600.0000 mg/m2 | Freq: Once | INTRAVENOUS | Status: AC
  Administered 2022-03-15: 1020 mg via INTRAVENOUS
  Filled 2022-03-15: qty 51

## 2022-03-15 MED ORDER — SODIUM CHLORIDE 0.9 % IV SOLN: Freq: Once | INTRAVENOUS | Status: AC

## 2022-03-15 MED ORDER — FAMOTIDINE IN NACL 20-0.9 MG/50ML-% IV SOLN
20.0000 mg | Freq: Once | INTRAVENOUS | Status: AC
  Administered 2022-03-15: 20 mg via INTRAVENOUS
  Filled 2022-03-15: qty 50

## 2022-03-15 NOTE — Patient Instructions (Signed)
Cyclophosphamide Injection ?What is this medication? ?CYCLOPHOSPHAMIDE (sye kloe FOSS fa mide) is a chemotherapy drug. It slows the growth of cancer cells. This medicine is used to treat many types of cancer like lymphoma, myeloma, leukemia, breast cancer, and ovarian cancer, to name a few. ?This medicine may be used for other purposes; ask your health care provider or pharmacist if you have questions. ?COMMON BRAND NAME(S): Cytoxan, Neosar ?What should I tell my care team before I take this medication? ?They need to know if you have any of these conditions: ?heart disease ?history of irregular heartbeat ?infection ?kidney disease ?liver disease ?low blood counts, like white cells, platelets, or red blood cells ?on hemodialysis ?recent or ongoing radiation therapy ?scarring or thickening of the lungs ?trouble passing urine ?an unusual or allergic reaction to cyclophosphamide, other medicines, foods, dyes, or preservatives ?pregnant or trying to get pregnant ?breast-feeding ?How should I use this medication? ?This drug is usually given as an injection into a vein or muscle or by infusion into a vein. It is administered in a hospital or clinic by a specially trained health care professional. ?Talk to your pediatrician regarding the use of this medicine in children. Special care may be needed. ?Overdosage: If you think you have taken too much of this medicine contact a poison control center or emergency room at once. ?NOTE: This medicine is only for you. Do not share this medicine with others. ?What if I miss a dose? ?It is important not to miss your dose. Call your doctor or health care professional if you are unable to keep an appointment. ?What may interact with this medication? ?amphotericin B ?azathioprine ?certain antivirals for HIV or hepatitis ?certain medicines for blood pressure, heart disease, irregular heart beat ?certain medicines that treat or prevent blood clots like warfarin ?certain other medicines for  cancer ?cyclosporine ?etanercept ?indomethacin ?medicines that relax muscles for surgery ?medicines to increase blood counts ?metronidazole ?This list may not describe all possible interactions. Give your health care provider a list of all the medicines, herbs, non-prescription drugs, or dietary supplements you use. Also tell them if you smoke, drink alcohol, or use illegal drugs. Some items may interact with your medicine. ?What should I watch for while using this medication? ?Your condition will be monitored carefully while you are receiving this medicine. ?You may need blood work done while you are taking this medicine. ?Drink water or other fluids as directed. Urinate often, even at night. ?Some products may contain alcohol. Ask your health care professional if this medicine contains alcohol. Be sure to tell all health care professionals you are taking this medicine. Certain medicines, like metronidazole and disulfiram, can cause an unpleasant reaction when taken with alcohol. The reaction includes flushing, headache, nausea, vomiting, sweating, and increased thirst. The reaction can last from 30 minutes to several hours. ?Do not become pregnant while taking this medicine or for 1 year after stopping it. Women should inform their health care professional if they wish to become pregnant or think they might be pregnant. Men should not father a child while taking this medicine and for 4 months after stopping it. There is potential for serious side effects to an unborn child. Talk to your health care professional for more information. ?Do not breast-feed an infant while taking this medicine or for 1 week after stopping it. ?This medicine has caused ovarian failure in some women. This medicine may make it more difficult to get pregnant. Talk to your health care professional if you are concerned  about your fertility. ?This medicine has caused decreased sperm counts in some men. This may make it more difficult to  father a child. Talk to your health care professional if you are concerned about your fertility. ?Call your health care professional for advice if you get a fever, chills, or sore throat, or other symptoms of a cold or flu. Do not treat yourself. This medicine decreases your body's ability to fight infections. Try to avoid being around people who are sick. ?Avoid taking medicines that contain aspirin, acetaminophen, ibuprofen, naproxen, or ketoprofen unless instructed by your health care professional. These medicines may hide a fever. ?Talk to your health care professional about your risk of cancer. You may be more at risk for certain types of cancer if you take this medicine. ?If you are going to need surgery or other procedure, tell your health care professional that you are using this medicine. ?Be careful brushing or flossing your teeth or using a toothpick because you may get an infection or bleed more easily. If you have any dental work done, tell your dentist you are receiving this medicine. ?What side effects may I notice from receiving this medication? ?Side effects that you should report to your doctor or health care professional as soon as possible: ?allergic reactions like skin rash, itching or hives, swelling of the face, lips, or tongue ?breathing problems ?nausea, vomiting ?signs and symptoms of bleeding such as bloody or black, tarry stools; red or dark brown urine; spitting up blood or brown material that looks like coffee grounds; red spots on the skin; unusual bruising or bleeding from the eyes, gums, or nose ?signs and symptoms of heart failure like fast, irregular heartbeat, sudden weight gain; swelling of the ankles, feet, hands ?signs and symptoms of infection like fever; chills; cough; sore throat; pain or trouble passing urine ?signs and symptoms of kidney injury like trouble passing urine or change in the amount of urine ?signs and symptoms of liver injury like dark yellow or brown urine;  general ill feeling or flu-like symptoms; light-colored stools; loss of appetite; nausea; right upper belly pain; unusually weak or tired; yellowing of the eyes or skin ?Side effects that usually do not require medical attention (report to your doctor or health care professional if they continue or are bothersome): ?confusion ?decreased hearing ?diarrhea ?facial flushing ?hair loss ?headache ?loss of appetite ?missed menstrual periods ?signs and symptoms of low red blood cells or anemia such as unusually weak or tired; feeling faint or lightheaded; falls ?skin discoloration ?This list may not describe all possible side effects. Call your doctor for medical advice about side effects. You may report side effects to FDA at 1-800-FDA-1088. ?Where should I keep my medication? ?This drug is given in a hospital or clinic and will not be stored at home. ?NOTE: This sheet is a summary. It may not cover all possible information. If you have questions about this medicine, talk to your doctor, pharmacist, or health care provider. ?? 2022 Elsevier/Gold Standard (2021-08-10 00:00:00) ?Doxorubicin injection ?What is this medication? ?DOXORUBICIN (dox oh ROO bi sin) is a chemotherapy drug. It is used to treat many kinds of cancer like leukemia, lymphoma, neuroblastoma, sarcoma, and Wilms' tumor. It is also used to treat bladder cancer, breast cancer, lung cancer, ovarian cancer, stomach cancer, and thyroid cancer. ?This medicine may be used for other purposes; ask your health care provider or pharmacist if you have questions. ?COMMON BRAND NAME(S): Adriamycin, Adriamycin PFS, Adriamycin RDF, Rubex ?What should I tell  my care team before I take this medication? ?They need to know if you have any of these conditions: ?heart disease ?history of low blood counts caused by a medicine ?liver disease ?recent or ongoing radiation therapy ?an unusual or allergic reaction to doxorubicin, other chemotherapy agents, other medicines, foods,  dyes, or preservatives ?pregnant or trying to get pregnant ?breast-feeding ?How should I use this medication? ?This drug is given as an infusion into a vein. It is administered in a hospital or clinic by a sp

## 2022-03-16 ENCOUNTER — Encounter: Payer: Self-pay | Admitting: Oncology

## 2022-03-17 ENCOUNTER — Ambulatory Visit: Payer: Managed Care, Other (non HMO)

## 2022-03-21 ENCOUNTER — Telehealth: Payer: Self-pay

## 2022-03-21 NOTE — Telephone Encounter (Signed)
PCP recommended that she see the surgeon that place port.  She has an appt @ 8:30am to see Dr. Ladora Daniel about neck soreness. ?

## 2022-03-22 ENCOUNTER — Other Ambulatory Visit: Payer: Self-pay | Admitting: Hematology and Oncology

## 2022-03-22 ENCOUNTER — Telehealth: Payer: Self-pay

## 2022-03-22 MED ORDER — RIVAROXABAN 15 MG PO TABS: 15.0000 mg | ORAL_TABLET | Freq: Two times a day (BID) | ORAL | 0 refills | Status: DC

## 2022-03-22 MED ORDER — RIVAROXABAN 10 MG PO TABS: 10.0000 mg | ORAL_TABLET | Freq: Every day | ORAL | 3 refills | Status: DC

## 2022-03-22 NOTE — Telephone Encounter (Signed)
Per Dr. Bobby Rumpf please have Almira Bar, NP order Xarelto '15mg'$  BID  for 1 week than '20mg'$  daily for 2.5 months.   ? ?Port okay to use per Dr. Noberto Retort. ? ? ? ? ? ?

## 2022-03-23 ENCOUNTER — Encounter: Payer: Self-pay | Admitting: Vascular Surgery

## 2022-03-24 NOTE — Progress Notes (Signed)
?[facility]  ?[address] ?[place],  [place]  [address] ?[phone] ? ?Clinic Day:  03/25/2022 ? ?Referring physician: Gweneth Fritter, FNP ? ? ?HISTORY OF PRESENT ILLNESS:  ?The patient is a 66 y.o. female with stage IIIA hormone positive breast cancer.  The patient is currently undergoing neoadjuvant chemotherapy.  She comes in today to be evaluated before heading into her 3rd cycle of dose dense Adriamycin/Cytoxan.  The patient claims of tolerated her 2nd cycle of dose dense chemotherapy fairly well.  She denies having any significant side effects from her chemotherapy.  If anything, her most significant problem is bone pain from her white cell shots, which have been given to keep her on schedule for chemotherapy.  She continues to notice an improvement in her left breast since her neoadjuvant chemotherapy was started. ? ?PHYSICAL EXAM:  ?Blood pressure (!) 143/66, pulse 86, temperature 98.2 ?F (36.8 ?C), resp. rate 18, height 5' 7.7" (1.72 m), weight 135 lb 4.8 oz (61.4 kg), SpO2 97 %. ?Wt Readings from Last 3 Encounters:  ?03/25/22 135 lb 4.8 oz (61.4 kg)  ?03/15/22 136 lb (61.7 kg)  ?03/11/22 134 lb 9.6 oz (61.1 kg)  ? ?Body mass index is 20.76 kg/m?Marland Kitchen ?Performance status (ECOG): 0 - Asymptomatic ?Physical Exam ?Constitutional:   ?   Appearance: Normal appearance.  ?HENT:  ?   Mouth/Throat:  ?   Pharynx: Oropharynx is clear. No oropharyngeal exudate.  ?Cardiovascular:  ?   Rate and Rhythm: Normal rate and regular rhythm.  ?   Heart sounds: No murmur heard. ?  No friction rub. No gallop.  ?Pulmonary:  ?   Breath sounds: Normal breath sounds.  ?Chest:  ?Breasts: ?   Right: Normal. No swelling, bleeding, inverted nipple, mass, nipple discharge or skin change.  ?   Left: Inverted nipple (less nipple inversion seen) and mass (decreased in size to 5.5 cm) present. No swelling, bleeding, nipple discharge or skin change.  ?Abdominal:  ?   General: Bowel sounds are normal. There is  no distension.  ?   Palpations: Abdomen is soft. There is no mass.  ?   Tenderness: There is no abdominal tenderness.  ?Musculoskeletal:     ?   General: No tenderness.  ?   Cervical back: Normal range of motion and neck supple.  ?   Right lower leg: No edema.  ?   Left lower leg: No edema.  ?Lymphadenopathy:  ?   Cervical: No cervical adenopathy.  ?   Right cervical: No superficial, deep or posterior cervical adenopathy. ?   Left cervical: No superficial, deep or posterior cervical adenopathy.  ?   Upper Body:  ?   Right upper body: No supraclavicular or axillary adenopathy.  ?   Left upper body: No supraclavicular or axillary (no longer palpable) adenopathy.  ?   Lower Body: No right inguinal adenopathy. No left inguinal adenopathy.  ?Skin: ?   Coloration: Skin is not jaundiced.  ?   Findings: No lesion or rash.  ?Neurological:  ?   General: No focal deficit present.  ?   Mental Status: She is alert and oriented to person, place, and time. Mental status is at baseline.  ?Psychiatric:     ?   Mood and Affect: Mood normal.     ?   Behavior: Behavior normal.     ?   Thought Content: Thought content normal.     ?   Judgment: Judgment normal.  ? ? ?  LABS:  ? ? ?  Latest Ref Rng & Units 03/25/2022  ? 12:00 AM 03/11/2022  ? 12:00 AM 02/28/2022  ? 12:00 AM  ?CBC  ?WBC  5.4      8.6      7.9       ?Hemoglobin 12.0 - 16.0 15.1      12.4      14.1       ?Hematocrit 36 - 46 48      38      43       ?Platelets 150 - 400 K/uL 220      114      330       ?  ? This result is from an external source.  ? ? ?  Latest Ref Rng & Units 03/25/2022  ? 12:00 AM 03/11/2022  ? 12:00 AM 02/28/2022  ? 12:00 AM  ?CMP  ?BUN 4 - '21 17      12      15       '$ ?Creatinine 0.5 - 1.1 0.5      0.7      0.7       ?Sodium 137 - 147 143      139      139       ?Potassium 3.5 - 5.1 mEq/L 4.2      3.9      4.3       ?Chloride 99 - 108 105      102      103       ?CO2 13 - '22 25      30      '$ 33       ?Calcium 8.7 - 10.7 9.8      8.9      8.9       ?Alkaline Phos 25 -  125 64      84      58       ?AST 13 - 35 '28      22      25       '$ ?ALT 7 - 35 U/L '30      17      20       '$ ?  ? This result is from an external source.  ? ?ASSESSMENT & PLAN:  ?A 66 y.o. female with stage IIIA hormone positive breast cancer.  Per her physical exam today, there continues to be an improvement in her disease from her dose dense chemotherapy.  She will proceed with her third cycle of dose dense Adriamycin/Cytoxan early next week.  She will continue to receive Neulasta after each cycle of treatment to prevent severe neutropenia from causing any complications.  Clinically, the patient is doing very well.  I will see her back in 2 weeks before she has into her fourth and final cycle of dose dense Adriamycin/Cytoxan.  The patient understands all the plans discussed today and is in agreement with them. ?  ?Gen Clagg Macarthur Critchley, MD   ? ? ?  ?

## 2022-03-25 ENCOUNTER — Inpatient Hospital Stay: Payer: Managed Care, Other (non HMO)

## 2022-03-25 ENCOUNTER — Inpatient Hospital Stay (INDEPENDENT_AMBULATORY_CARE_PROVIDER_SITE_OTHER): Payer: Managed Care, Other (non HMO) | Admitting: Oncology

## 2022-03-25 ENCOUNTER — Other Ambulatory Visit: Payer: Self-pay | Admitting: Hematology and Oncology

## 2022-03-25 ENCOUNTER — Other Ambulatory Visit: Payer: Self-pay

## 2022-03-25 VITALS — BP 143/66 | HR 86 | Temp 98.2°F | Resp 18 | Ht 67.7 in | Wt 135.3 lb

## 2022-03-25 DIAGNOSIS — L03019 Cellulitis of unspecified finger: Secondary | ICD-10-CM

## 2022-03-25 DIAGNOSIS — C50412 Malignant neoplasm of upper-outer quadrant of left female breast: Secondary | ICD-10-CM

## 2022-03-25 DIAGNOSIS — C50112 Malignant neoplasm of central portion of left female breast: Secondary | ICD-10-CM

## 2022-03-25 DIAGNOSIS — Z170 Estrogen receptor positive status [ER+]: Secondary | ICD-10-CM | POA: Diagnosis not present

## 2022-03-25 LAB — BASIC METABOLIC PANEL
BUN: 17 (ref 4–21)
CO2: 25 — AB (ref 13–22)
Chloride: 105 (ref 99–108)
Creatinine: 0.5 (ref 0.5–1.1)
Glucose: 180
Potassium: 4.2 mEq/L (ref 3.5–5.1)
Sodium: 143 (ref 137–147)

## 2022-03-25 LAB — CBC AND DIFFERENTIAL
HCT: 48 — AB (ref 36–46)
Hemoglobin: 15.1 (ref 12.0–16.0)
Neutrophils Absolute: 3.24
Platelets: 220 10*3/uL (ref 150–400)
WBC: 5.4

## 2022-03-25 LAB — COMPREHENSIVE METABOLIC PANEL
Albumin: 4.5 (ref 3.5–5.0)
Calcium: 9.8 (ref 8.7–10.7)

## 2022-03-25 LAB — CBC
MCV: 85 (ref 81–99)
RBC: 5.67 — AB (ref 3.87–5.11)

## 2022-03-25 LAB — HEPATIC FUNCTION PANEL
ALT: 30 U/L (ref 7–35)
AST: 28 (ref 13–35)
Alkaline Phosphatase: 64 (ref 25–125)
Bilirubin, Total: 0.4

## 2022-03-27 ENCOUNTER — Encounter: Payer: Self-pay | Admitting: Oncology

## 2022-03-29 ENCOUNTER — Inpatient Hospital Stay: Payer: Managed Care, Other (non HMO)

## 2022-03-29 ENCOUNTER — Other Ambulatory Visit: Payer: Self-pay

## 2022-03-29 VITALS — BP 129/68 | HR 73 | Temp 98.3°F | Resp 18 | Ht 67.7 in | Wt 136.0 lb

## 2022-03-29 DIAGNOSIS — C50112 Malignant neoplasm of central portion of left female breast: Secondary | ICD-10-CM

## 2022-03-29 DIAGNOSIS — Z5111 Encounter for antineoplastic chemotherapy: Secondary | ICD-10-CM | POA: Diagnosis not present

## 2022-03-29 MED ORDER — SODIUM CHLORIDE 0.9 % IV SOLN: Freq: Once | INTRAVENOUS | Status: AC

## 2022-03-29 MED ORDER — SODIUM CHLORIDE 0.9 % IV SOLN
10.0000 mg | Freq: Once | INTRAVENOUS | Status: AC
  Administered 2022-03-29: 10 mg via INTRAVENOUS
  Filled 2022-03-29: qty 10

## 2022-03-29 MED ORDER — PROCHLORPERAZINE MALEATE 10 MG PO TABS
10.0000 mg | ORAL_TABLET | Freq: Once | ORAL | Status: AC
  Administered 2022-03-29: 10 mg via ORAL
  Filled 2022-03-29: qty 1

## 2022-03-29 MED ORDER — FAMOTIDINE IN NACL 20-0.9 MG/50ML-% IV SOLN
20.0000 mg | Freq: Once | INTRAVENOUS | Status: AC
  Administered 2022-03-29: 20 mg via INTRAVENOUS
  Filled 2022-03-29: qty 50

## 2022-03-29 MED ORDER — SODIUM CHLORIDE 0.9% FLUSH
10.0000 mL | INTRAVENOUS | Status: DC | PRN
  Administered 2022-03-29: 10 mL

## 2022-03-29 MED ORDER — DOXORUBICIN HCL CHEMO IV INJECTION 2 MG/ML
60.0000 mg/m2 | Freq: Once | INTRAVENOUS | Status: AC
  Administered 2022-03-29: 102 mg via INTRAVENOUS
  Filled 2022-03-29: qty 51

## 2022-03-29 MED ORDER — HEPARIN SOD (PORK) LOCK FLUSH 100 UNIT/ML IV SOLN
500.0000 [IU] | Freq: Once | INTRAVENOUS | Status: AC | PRN
  Administered 2022-03-29: 500 [IU]

## 2022-03-29 MED ORDER — SODIUM CHLORIDE 0.9 % IV SOLN
600.0000 mg/m2 | Freq: Once | INTRAVENOUS | Status: AC
  Administered 2022-03-29: 1020 mg via INTRAVENOUS
  Filled 2022-03-29: qty 42

## 2022-03-29 MED ORDER — PALONOSETRON HCL INJECTION 0.25 MG/5ML
0.2500 mg | Freq: Once | INTRAVENOUS | Status: AC
  Administered 2022-03-29: 0.25 mg via INTRAVENOUS
  Filled 2022-03-29: qty 5

## 2022-03-29 NOTE — Patient Instructions (Signed)
Cyclophosphamide Injection ?What is this medication? ?CYCLOPHOSPHAMIDE (sye kloe FOSS fa mide) is a chemotherapy drug. It slows the growth of cancer cells. This medicine is used to treat many types of cancer like lymphoma, myeloma, leukemia, breast cancer, and ovarian cancer, to name a few. ?This medicine may be used for other purposes; ask your health care provider or pharmacist if you have questions. ?COMMON BRAND NAME(S): Cyclophosphamide, Cytoxan, Neosar ?What should I tell my care team before I take this medication? ?They need to know if you have any of these conditions: ?heart disease ?history of irregular heartbeat ?infection ?kidney disease ?liver disease ?low blood counts, like white cells, platelets, or red blood cells ?on hemodialysis ?recent or ongoing radiation therapy ?scarring or thickening of the lungs ?trouble passing urine ?an unusual or allergic reaction to cyclophosphamide, other medicines, foods, dyes, or preservatives ?pregnant or trying to get pregnant ?breast-feeding ?How should I use this medication? ?This drug is usually given as an injection into a vein or muscle or by infusion into a vein. It is administered in a hospital or clinic by a specially trained health care professional. ?Talk to your pediatrician regarding the use of this medicine in children. Special care may be needed. ?Overdosage: If you think you have taken too much of this medicine contact a poison control center or emergency room at once. ?NOTE: This medicine is only for you. Do not share this medicine with others. ?What if I miss a dose? ?It is important not to miss your dose. Call your doctor or health care professional if you are unable to keep an appointment. ?What may interact with this medication? ?amphotericin B ?azathioprine ?certain antivirals for HIV or hepatitis ?certain medicines for blood pressure, heart disease, irregular heart beat ?certain medicines that treat or prevent blood clots like warfarin ?certain  other medicines for cancer ?cyclosporine ?etanercept ?indomethacin ?medicines that relax muscles for surgery ?medicines to increase blood counts ?metronidazole ?This list may not describe all possible interactions. Give your health care provider a list of all the medicines, herbs, non-prescription drugs, or dietary supplements you use. Also tell them if you smoke, drink alcohol, or use illegal drugs. Some items may interact with your medicine. ?What should I watch for while using this medication? ?Your condition will be monitored carefully while you are receiving this medicine. ?You may need blood work done while you are taking this medicine. ?Drink water or other fluids as directed. Urinate often, even at night. ?Some products may contain alcohol. Ask your health care professional if this medicine contains alcohol. Be sure to tell all health care professionals you are taking this medicine. Certain medicines, like metronidazole and disulfiram, can cause an unpleasant reaction when taken with alcohol. The reaction includes flushing, headache, nausea, vomiting, sweating, and increased thirst. The reaction can last from 30 minutes to several hours. ?Do not become pregnant while taking this medicine or for 1 year after stopping it. Women should inform their health care professional if they wish to become pregnant or think they might be pregnant. Men should not father a child while taking this medicine and for 4 months after stopping it. There is potential for serious side effects to an unborn child. Talk to your health care professional for more information. ?Do not breast-feed an infant while taking this medicine or for 1 week after stopping it. ?This medicine has caused ovarian failure in some women. This medicine may make it more difficult to get pregnant. Talk to your health care professional if you are  concerned about your fertility. ?This medicine has caused decreased sperm counts in some men. This may make it  more difficult to father a child. Talk to your health care professional if you are concerned about your fertility. ?Call your health care professional for advice if you get a fever, chills, or sore throat, or other symptoms of a cold or flu. Do not treat yourself. This medicine decreases your body's ability to fight infections. Try to avoid being around people who are sick. ?Avoid taking medicines that contain aspirin, acetaminophen, ibuprofen, naproxen, or ketoprofen unless instructed by your health care professional. These medicines may hide a fever. ?Talk to your health care professional about your risk of cancer. You may be more at risk for certain types of cancer if you take this medicine. ?If you are going to need surgery or other procedure, tell your health care professional that you are using this medicine. ?Be careful brushing or flossing your teeth or using a toothpick because you may get an infection or bleed more easily. If you have any dental work done, tell your dentist you are receiving this medicine. ?What side effects may I notice from receiving this medication? ?Side effects that you should report to your doctor or health care professional as soon as possible: ?allergic reactions like skin rash, itching or hives, swelling of the face, lips, or tongue ?breathing problems ?nausea, vomiting ?signs and symptoms of bleeding such as bloody or black, tarry stools; red or dark brown urine; spitting up blood or brown material that looks like coffee grounds; red spots on the skin; unusual bruising or bleeding from the eyes, gums, or nose ?signs and symptoms of heart failure like fast, irregular heartbeat, sudden weight gain; swelling of the ankles, feet, hands ?signs and symptoms of infection like fever; chills; cough; sore throat; pain or trouble passing urine ?signs and symptoms of kidney injury like trouble passing urine or change in the amount of urine ?signs and symptoms of liver injury like dark yellow  or brown urine; general ill feeling or flu-like symptoms; light-colored stools; loss of appetite; nausea; right upper belly pain; unusually weak or tired; yellowing of the eyes or skin ?Side effects that usually do not require medical attention (report to your doctor or health care professional if they continue or are bothersome): ?confusion ?decreased hearing ?diarrhea ?facial flushing ?hair loss ?headache ?loss of appetite ?missed menstrual periods ?signs and symptoms of low red blood cells or anemia such as unusually weak or tired; feeling faint or lightheaded; falls ?skin discoloration ?This list may not describe all possible side effects. Call your doctor for medical advice about side effects. You may report side effects to FDA at 1-800-FDA-1088. ?Where should I keep my medication? ?This drug is given in a hospital or clinic and will not be stored at home. ?NOTE: This sheet is a summary. It may not cover all possible information. If you have questions about this medicine, talk to your doctor, pharmacist, or health care provider. ?? 2023 Elsevier/Gold Standard (2021-10-22 00:00:00) ?Doxorubicin injection ?What is this medication? ?DOXORUBICIN (dox oh ROO bi sin) is a chemotherapy drug. It is used to treat many kinds of cancer like leukemia, lymphoma, neuroblastoma, sarcoma, and Wilms' tumor. It is also used to treat bladder cancer, breast cancer, lung cancer, ovarian cancer, stomach cancer, and thyroid cancer. ?This medicine may be used for other purposes; ask your health care provider or pharmacist if you have questions. ?COMMON BRAND NAME(S): Adriamycin, Adriamycin PFS, Adriamycin RDF, Rubex ?What should I  tell my care team before I take this medication? ?They need to know if you have any of these conditions: ?heart disease ?history of low blood counts caused by a medicine ?liver disease ?recent or ongoing radiation therapy ?an unusual or allergic reaction to doxorubicin, other chemotherapy agents, other  medicines, foods, dyes, or preservatives ?pregnant or trying to get pregnant ?breast-feeding ?How should I use this medication? ?This drug is given as an infusion into a vein. It is administered in a hospital

## 2022-04-08 ENCOUNTER — Inpatient Hospital Stay: Payer: Managed Care, Other (non HMO) | Attending: Oncology | Admitting: Oncology

## 2022-04-08 ENCOUNTER — Inpatient Hospital Stay: Payer: Managed Care, Other (non HMO)

## 2022-04-08 ENCOUNTER — Other Ambulatory Visit: Payer: Self-pay

## 2022-04-08 ENCOUNTER — Encounter: Payer: Self-pay | Admitting: Oncology

## 2022-04-08 VITALS — BP 129/67 | HR 79 | Temp 98.7°F | Resp 18 | Ht 66.6 in | Wt 135.8 lb

## 2022-04-08 DIAGNOSIS — C50912 Malignant neoplasm of unspecified site of left female breast: Secondary | ICD-10-CM | POA: Insufficient documentation

## 2022-04-08 DIAGNOSIS — C50412 Malignant neoplasm of upper-outer quadrant of left female breast: Secondary | ICD-10-CM

## 2022-04-08 DIAGNOSIS — Z170 Estrogen receptor positive status [ER+]: Secondary | ICD-10-CM

## 2022-04-08 DIAGNOSIS — Z5111 Encounter for antineoplastic chemotherapy: Secondary | ICD-10-CM | POA: Insufficient documentation

## 2022-04-08 NOTE — Progress Notes (Signed)
?[facility]  ?[address] ?[place],  [place]  [address] ?[phone] ? ?Clinic Day:  04/08/2022 ? ?Referring physician: Gweneth Fritter, FNP ? ? ?HISTORY OF PRESENT ILLNESS:  ?The patient is a 66 y.o. female with stage IIIA hormone positive breast cancer.  The patient is currently undergoing neoadjuvant chemotherapy.  She comes in today to be evaluated before heading into her 4th and final cycle of dose dense Adriamycin/Cytoxan.  The patient claims of tolerated her 3rd cycle of dose dense chemotherapy fairly well.  She denies having any significant side effects from her chemotherapy. She continues to notice an improvement in her left breast since her neoadjuvant chemotherapy was started. ? ?PHYSICAL EXAM:  ?Blood pressure 129/67, pulse 79, temperature 98.7 ?F (37.1 ?C), temperature source Oral, resp. rate 18, height 5' 6.6" (1.692 m), weight 135 lb 12.8 oz (61.6 kg), SpO2 98 %. ?Wt Readings from Last 3 Encounters:  ?04/08/22 135 lb 12.8 oz (61.6 kg)  ?03/29/22 136 lb (61.7 kg)  ?03/25/22 135 lb 4.8 oz (61.4 kg)  ? ?Body mass index is 21.53 kg/m?Marland Kitchen ?Performance status (ECOG): 0 - Asymptomatic ?Physical Exam ?Constitutional:   ?   Appearance: Normal appearance.  ?HENT:  ?   Mouth/Throat:  ?   Pharynx: Oropharynx is clear. No oropharyngeal exudate.  ?Cardiovascular:  ?   Rate and Rhythm: Normal rate and regular rhythm.  ?   Heart sounds: No murmur heard. ?  No friction rub. No gallop.  ?Pulmonary:  ?   Breath sounds: Normal breath sounds.  ?Chest:  ?Breasts: ?   Right: Normal. No swelling, bleeding, inverted nipple, mass, nipple discharge or skin change.  ?   Left: Inverted nipple (less nipple inversion seen) and mass (mass is stable 5.5 cm, but much softer and incospicuous versus previously) present. No swelling, bleeding, nipple discharge or skin change.  ?Abdominal:  ?   General: Bowel sounds are normal. There is no distension.  ?   Palpations: Abdomen is soft. There is no mass.  ?    Tenderness: There is no abdominal tenderness.  ?Musculoskeletal:     ?   General: No tenderness.  ?   Cervical back: Normal range of motion and neck supple.  ?   Right lower leg: No edema.  ?   Left lower leg: No edema.  ?Lymphadenopathy:  ?   Cervical: No cervical adenopathy.  ?   Right cervical: No superficial, deep or posterior cervical adenopathy. ?   Left cervical: No superficial, deep or posterior cervical adenopathy.  ?   Upper Body:  ?   Right upper body: No supraclavicular or axillary adenopathy.  ?   Left upper body: No supraclavicular or axillary (no longer palpable) adenopathy.  ?   Lower Body: No right inguinal adenopathy. No left inguinal adenopathy.  ?Skin: ?   Coloration: Skin is not jaundiced.  ?   Findings: No lesion or rash.  ?Neurological:  ?   General: No focal deficit present.  ?   Mental Status: She is alert and oriented to person, place, and time. Mental status is at baseline.  ?Psychiatric:     ?   Mood and Affect: Mood normal.     ?   Behavior: Behavior normal.     ?   Thought Content: Thought content normal.     ?   Judgment: Judgment normal.  ? ? ?LABS:  ? ? ?ASSESSMENT & PLAN:  ?A 66 y.o. female with stage IIIA hormone  positive breast cancer.  Per her physical exam today, there continues to be an improvement in her disease from her dose dense chemotherapy.  She will proceed with her 4th and final cycle of dose dense Adriamycin/Cytoxan early next week.  She will continue to receive Neulasta after each cycle of treatment to prevent severe neutropenia from causing any complications.  Clinically, the patient is doing very well.  I will see her back in 2 weeks before she heads into her 1st cycle of dose dense paclitaxel.  The patient understands all the plans discussed today and is in agreement with them. ?  ?Cherith Tewell Macarthur Critchley, MD   ? ? ?  ?

## 2022-04-10 ENCOUNTER — Encounter: Payer: Self-pay | Admitting: Oncology

## 2022-04-12 ENCOUNTER — Inpatient Hospital Stay: Payer: Managed Care, Other (non HMO)

## 2022-04-12 VITALS — BP 140/76 | HR 67 | Resp 18 | Ht 66.6 in | Wt 133.1 lb

## 2022-04-12 DIAGNOSIS — C50112 Malignant neoplasm of central portion of left female breast: Secondary | ICD-10-CM

## 2022-04-12 DIAGNOSIS — C50912 Malignant neoplasm of unspecified site of left female breast: Secondary | ICD-10-CM | POA: Diagnosis present

## 2022-04-12 DIAGNOSIS — Z5111 Encounter for antineoplastic chemotherapy: Secondary | ICD-10-CM | POA: Diagnosis not present

## 2022-04-12 MED ORDER — PALONOSETRON HCL INJECTION 0.25 MG/5ML
0.2500 mg | Freq: Once | INTRAVENOUS | Status: AC
  Administered 2022-04-12: 0.25 mg via INTRAVENOUS
  Filled 2022-04-12: qty 5

## 2022-04-12 MED ORDER — SODIUM CHLORIDE 0.9% FLUSH
10.0000 mL | INTRAVENOUS | Status: DC | PRN
  Administered 2022-04-12: 10 mL

## 2022-04-12 MED ORDER — CEPHALEXIN 500 MG PO CAPS: 500.0000 mg | ORAL_CAPSULE | Freq: Four times a day (QID) | ORAL | 0 refills | Status: AC

## 2022-04-12 MED ORDER — SODIUM CHLORIDE 0.9 % IV SOLN
10.0000 mg | Freq: Once | INTRAVENOUS | Status: AC
  Administered 2022-04-12: 10 mg via INTRAVENOUS
  Filled 2022-04-12: qty 10

## 2022-04-12 MED ORDER — SODIUM CHLORIDE 0.9 % IV SOLN: Freq: Once | INTRAVENOUS | Status: AC

## 2022-04-12 MED ORDER — HEPARIN SOD (PORK) LOCK FLUSH 100 UNIT/ML IV SOLN
500.0000 [IU] | Freq: Once | INTRAVENOUS | Status: AC | PRN
  Administered 2022-04-12: 500 [IU]

## 2022-04-12 MED ORDER — PROCHLORPERAZINE MALEATE 10 MG PO TABS
10.0000 mg | ORAL_TABLET | Freq: Once | ORAL | Status: AC
  Administered 2022-04-12: 10 mg via ORAL
  Filled 2022-04-12: qty 1

## 2022-04-12 MED ORDER — DOXORUBICIN HCL CHEMO IV INJECTION 2 MG/ML
60.0000 mg/m2 | Freq: Once | INTRAVENOUS | Status: AC
  Administered 2022-04-12: 102 mg via INTRAVENOUS
  Filled 2022-04-12: qty 51

## 2022-04-12 MED ORDER — SODIUM CHLORIDE 0.9 % IV SOLN
600.0000 mg/m2 | Freq: Once | INTRAVENOUS | Status: AC
  Administered 2022-04-12: 1020 mg via INTRAVENOUS
  Filled 2022-04-12: qty 51

## 2022-04-12 MED ORDER — FAMOTIDINE IN NACL 20-0.9 MG/50ML-% IV SOLN
20.0000 mg | Freq: Once | INTRAVENOUS | Status: AC
  Administered 2022-04-12: 20 mg via INTRAVENOUS
  Filled 2022-04-12: qty 50

## 2022-04-12 NOTE — Patient Instructions (Signed)
Cyclophosphamide Injection ?What is this medication? ?CYCLOPHOSPHAMIDE (sye kloe FOSS fa mide) is a chemotherapy drug. It slows the growth of cancer cells. This medicine is used to treat many types of cancer like lymphoma, myeloma, leukemia, breast cancer, and ovarian cancer, to name a few. ?This medicine may be used for other purposes; ask your health care provider or pharmacist if you have questions. ?COMMON BRAND NAME(S): Cyclophosphamide, Cytoxan, Neosar ?What should I tell my care team before I take this medication? ?They need to know if you have any of these conditions: ?heart disease ?history of irregular heartbeat ?infection ?kidney disease ?liver disease ?low blood counts, like white cells, platelets, or red blood cells ?on hemodialysis ?recent or ongoing radiation therapy ?scarring or thickening of the lungs ?trouble passing urine ?an unusual or allergic reaction to cyclophosphamide, other medicines, foods, dyes, or preservatives ?pregnant or trying to get pregnant ?breast-feeding ?How should I use this medication? ?This drug is usually given as an injection into a vein or muscle or by infusion into a vein. It is administered in a hospital or clinic by a specially trained health care professional. ?Talk to your pediatrician regarding the use of this medicine in children. Special care may be needed. ?Overdosage: If you think you have taken too much of this medicine contact a poison control center or emergency room at once. ?NOTE: This medicine is only for you. Do not share this medicine with others. ?What if I miss a dose? ?It is important not to miss your dose. Call your doctor or health care professional if you are unable to keep an appointment. ?What may interact with this medication? ?amphotericin B ?azathioprine ?certain antivirals for HIV or hepatitis ?certain medicines for blood pressure, heart disease, irregular heart beat ?certain medicines that treat or prevent blood clots like warfarin ?certain  other medicines for cancer ?cyclosporine ?etanercept ?indomethacin ?medicines that relax muscles for surgery ?medicines to increase blood counts ?metronidazole ?This list may not describe all possible interactions. Give your health care provider a list of all the medicines, herbs, non-prescription drugs, or dietary supplements you use. Also tell them if you smoke, drink alcohol, or use illegal drugs. Some items may interact with your medicine. ?What should I watch for while using this medication? ?Your condition will be monitored carefully while you are receiving this medicine. ?You may need blood work done while you are taking this medicine. ?Drink water or other fluids as directed. Urinate often, even at night. ?Some products may contain alcohol. Ask your health care professional if this medicine contains alcohol. Be sure to tell all health care professionals you are taking this medicine. Certain medicines, like metronidazole and disulfiram, can cause an unpleasant reaction when taken with alcohol. The reaction includes flushing, headache, nausea, vomiting, sweating, and increased thirst. The reaction can last from 30 minutes to several hours. ?Do not become pregnant while taking this medicine or for 1 year after stopping it. Women should inform their health care professional if they wish to become pregnant or think they might be pregnant. Men should not father a child while taking this medicine and for 4 months after stopping it. There is potential for serious side effects to an unborn child. Talk to your health care professional for more information. ?Do not breast-feed an infant while taking this medicine or for 1 week after stopping it. ?This medicine has caused ovarian failure in some women. This medicine may make it more difficult to get pregnant. Talk to your health care professional if you are  concerned about your fertility. ?This medicine has caused decreased sperm counts in some men. This may make it  more difficult to father a child. Talk to your health care professional if you are concerned about your fertility. ?Call your health care professional for advice if you get a fever, chills, or sore throat, or other symptoms of a cold or flu. Do not treat yourself. This medicine decreases your body's ability to fight infections. Try to avoid being around people who are sick. ?Avoid taking medicines that contain aspirin, acetaminophen, ibuprofen, naproxen, or ketoprofen unless instructed by your health care professional. These medicines may hide a fever. ?Talk to your health care professional about your risk of cancer. You may be more at risk for certain types of cancer if you take this medicine. ?If you are going to need surgery or other procedure, tell your health care professional that you are using this medicine. ?Be careful brushing or flossing your teeth or using a toothpick because you may get an infection or bleed more easily. If you have any dental work done, tell your dentist you are receiving this medicine. ?What side effects may I notice from receiving this medication? ?Side effects that you should report to your doctor or health care professional as soon as possible: ?allergic reactions like skin rash, itching or hives, swelling of the face, lips, or tongue ?breathing problems ?nausea, vomiting ?signs and symptoms of bleeding such as bloody or black, tarry stools; red or dark brown urine; spitting up blood or brown material that looks like coffee grounds; red spots on the skin; unusual bruising or bleeding from the eyes, gums, or nose ?signs and symptoms of heart failure like fast, irregular heartbeat, sudden weight gain; swelling of the ankles, feet, hands ?signs and symptoms of infection like fever; chills; cough; sore throat; pain or trouble passing urine ?signs and symptoms of kidney injury like trouble passing urine or change in the amount of urine ?signs and symptoms of liver injury like dark yellow  or brown urine; general ill feeling or flu-like symptoms; light-colored stools; loss of appetite; nausea; right upper belly pain; unusually weak or tired; yellowing of the eyes or skin ?Side effects that usually do not require medical attention (report to your doctor or health care professional if they continue or are bothersome): ?confusion ?decreased hearing ?diarrhea ?facial flushing ?hair loss ?headache ?loss of appetite ?missed menstrual periods ?signs and symptoms of low red blood cells or anemia such as unusually weak or tired; feeling faint or lightheaded; falls ?skin discoloration ?This list may not describe all possible side effects. Call your doctor for medical advice about side effects. You may report side effects to FDA at 1-800-FDA-1088. ?Where should I keep my medication? ?This drug is given in a hospital or clinic and will not be stored at home. ?NOTE: This sheet is a summary. It may not cover all possible information. If you have questions about this medicine, talk to your doctor, pharmacist, or health care provider. ?? 2023 Elsevier/Gold Standard (2021-10-22 00:00:00) ?Doxorubicin injection ?What is this medication? ?DOXORUBICIN (dox oh ROO bi sin) is a chemotherapy drug. It is used to treat many kinds of cancer like leukemia, lymphoma, neuroblastoma, sarcoma, and Wilms' tumor. It is also used to treat bladder cancer, breast cancer, lung cancer, ovarian cancer, stomach cancer, and thyroid cancer. ?This medicine may be used for other purposes; ask your health care provider or pharmacist if you have questions. ?COMMON BRAND NAME(S): Adriamycin, Adriamycin PFS, Adriamycin RDF, Rubex ?What should I  tell my care team before I take this medication? ?They need to know if you have any of these conditions: ?heart disease ?history of low blood counts caused by a medicine ?liver disease ?recent or ongoing radiation therapy ?an unusual or allergic reaction to doxorubicin, other chemotherapy agents, other  medicines, foods, dyes, or preservatives ?pregnant or trying to get pregnant ?breast-feeding ?How should I use this medication? ?This drug is given as an infusion into a vein. It is administered in a hospital

## 2022-04-13 ENCOUNTER — Encounter: Payer: Self-pay | Admitting: Oncology

## 2022-04-14 ENCOUNTER — Ambulatory Visit: Payer: Managed Care, Other (non HMO)

## 2022-04-21 ENCOUNTER — Encounter (INDEPENDENT_AMBULATORY_CARE_PROVIDER_SITE_OTHER): Payer: Managed Care, Other (non HMO) | Admitting: Ophthalmology

## 2022-04-21 ENCOUNTER — Other Ambulatory Visit: Payer: Self-pay

## 2022-04-22 ENCOUNTER — Inpatient Hospital Stay: Payer: Managed Care, Other (non HMO)

## 2022-04-22 ENCOUNTER — Inpatient Hospital Stay: Payer: Managed Care, Other (non HMO) | Admitting: Hematology and Oncology

## 2022-04-22 ENCOUNTER — Encounter: Payer: Self-pay | Admitting: Hematology and Oncology

## 2022-04-22 ENCOUNTER — Other Ambulatory Visit: Payer: Self-pay

## 2022-04-22 DIAGNOSIS — Z170 Estrogen receptor positive status [ER+]: Secondary | ICD-10-CM

## 2022-04-22 DIAGNOSIS — C50412 Malignant neoplasm of upper-outer quadrant of left female breast: Secondary | ICD-10-CM | POA: Diagnosis not present

## 2022-04-22 LAB — BASIC METABOLIC PANEL
BUN: 10 (ref 4–21)
CO2: 30 — AB (ref 13–22)
Chloride: 102 (ref 99–108)
Creatinine: 0.7 (ref 0.5–1.1)
Glucose: 91
Potassium: 4.1 mEq/L (ref 3.5–5.1)
Sodium: 140 (ref 137–147)

## 2022-04-22 LAB — HEPATIC FUNCTION PANEL
ALT: 18 U/L (ref 7–35)
AST: 22 (ref 13–35)
Alkaline Phosphatase: 73 (ref 25–125)
Bilirubin, Total: 0.3

## 2022-04-22 LAB — CBC AND DIFFERENTIAL
HCT: 29 — AB (ref 36–46)
Hemoglobin: 9.6 — AB (ref 12.0–16.0)
Neutrophils Absolute: 2.01
Platelets: 127 10*3/uL — AB (ref 150–400)
WBC: 3

## 2022-04-22 LAB — CBC: RBC: 3.12 — AB (ref 3.87–5.11)

## 2022-04-22 LAB — COMPREHENSIVE METABOLIC PANEL
Albumin: 4 (ref 3.5–5.0)
Calcium: 8.9 (ref 8.7–10.7)

## 2022-04-22 NOTE — Assessment & Plan Note (Signed)
A 66 y.o. female with stage IIIA hormone positive breast cancer.  Per her physical exam today, there continues to be an improvement in her disease from her dose dense chemotherapy.  She completed 4 cycles of dose dense Adriamycin/Cytoxan. She begins dose dense paclitaxel next Tuesday. We reviewed side effects. CBC and CMP are unremarkable today. She will return to clinic in 2 weeks for repeat evaluation prior to second cycle of dose dense paclitaxel.

## 2022-04-22 NOTE — Progress Notes (Cosign Needed)
Patient Care Team: Gweneth Fritter, FNP as PCP - General (Family Medicine) Laurell Roof, RN as Registered Nurse  Clinic Day:  04/22/2022  Referring physician: Gweneth Fritter, FNP  ASSESSMENT & PLAN:   Assessment & Plan: Malignant neoplasm of female breast [facility]) A 66 y.o. female with stage IIIA hormone positive breast cancer.  Per her physical exam today, there continues to be an improvement in her disease from her dose dense chemotherapy.  She completed 4 cycles of dose dense Adriamycin/Cytoxan. She begins dose dense paclitaxel next Tuesday. We reviewed side effects. CBC and CMP are unremarkable today. She will return to clinic in 2 weeks for repeat evaluation prior to second cycle of dose dense paclitaxel.    The patient understands the plans discussed today and is in agreement with them.  She knows to contact our office if she develops concerns prior to her next appointment.    Melodye Ped, NP  [facility] [address] [place] [address] Dept: [phone] Dept Fax: [phone]   Orders Placed This Encounter  Procedures   CBC and differential    This external order was created through the Results Console.   CBC    This external order was created through the Results Console.   Basic metabolic panel    This external order was created through the Results Console.   Comprehensive metabolic panel    This external order was created through the Results Console.   Hepatic function panel    This external order was created through the Results Console.      CHIEF COMPLAINT:  CC: A 66 year old female with history of breast cancer here for 2 week evaluation.  Current Treatment:  Dose dense paclitaxel  INTERVAL HISTORY:  Claris is here today for repeat clinical assessment. She denies fevers or chills. She denies pain. Her appetite is good. Her weight has been stable.  I have reviewed the past medical  history, past surgical history, social history and family history with the patient and they are unchanged from previous note.  ALLERGIES:  is allergic to [employer] dimeglumine] and sulfamethoxazole.  MEDICATIONS:  Current Outpatient Medications  Medication Sig Dispense Refill   Cholecalciferol (VITAMIN D3) 50 MCG (2000 UT) capsule Take by mouth.     cyanocobalamin 1000 MCG tablet Take 1 tablet by mouth daily.     dexamethasone (DECADRON) 4 MG tablet TAKE 2 TABLETS BY MOUTH ONCE DAILY STARTING DAY AFTER CHEMOTHERAPY. CONTINUE FOR 3 DAYS TOTAL     folic acid (FOLVITE) 1 MG tablet      methotrexate (RHEUMATREX) 2.5 MG tablet Takes on Monday weekly (Patient not taking: Reported on 04/22/2022)     metoprolol succinate (TOPROL-XL) 25 MG 24 hr tablet 12.5 mg at bedtime.     OLANZapine (ZYPREXA) 10 MG tablet Take 1 tablet (10 mg total) by mouth at bedtime. 30 tablet 3   ondansetron (ZOFRAN) 8 MG tablet      OVER THE COUNTER MEDICATION Take 1 capsule by mouth 3 (three) times a week. beta-carotene,A,-vits C,E/mins (OCUVITE ORAL)     prochlorperazine (COMPAZINE) 10 MG tablet      promethazine (PHENERGAN) 25 MG tablet Take 12.5 mg by mouth every 8 (eight) hours as needed.     rivaroxaban (XARELTO) 10 MG TABS tablet Take 1 tablet by mouth daily.     No current facility-administered medications for this visit.    HISTORY OF PRESENT ILLNESS:  Oncology History  Malignant neoplasm of female breast ([facility])  02/15/2022 Initial Diagnosis   Malignant neoplasm of female breast ([facility])    02/15/2022 Cancer Staging   Staging form: Breast, AJCC 8th Edition - Clinical stage from 02/15/2022: Stage IIIA (cT3, cN1, cM0, G2, ER+, PR-, HER2-) - Signed by Marice Potter, MD on 02/15/2022 Histopathologic type: Infiltrating duct carcinoma, NOS Stage prefix: Initial diagnosis Histologic grading system: 3 grade system    03/01/2022 -  Chemotherapy   Patient is on Treatment Plan : BREAST DOSE DENSE AC q14d x  4 cycles / DOSE DENSE Paclitaxel q14d x 4 cycles          REVIEW OF SYSTEMS:   Constitutional: Denies fevers, chills or abnormal weight loss Eyes: Denies blurriness of vision Ears, nose, mouth, throat, and face: Denies mucositis or sore throat Respiratory: Denies cough, dyspnea or wheezes Cardiovascular: Denies palpitation, chest discomfort or lower extremity swelling Gastrointestinal:  Denies nausea, heartburn or change in bowel habits Skin: Denies abnormal skin rashes Lymphatics: Denies new lymphadenopathy or easy bruising Neurological:Denies numbness, tingling or new weaknesses Behavioral/Psych: Mood is stable, no new changes  All other systems were reviewed with the patient and are negative.   VITALS:  Blood pressure 129/78, pulse 74, temperature 98.5 F (36.9 C), temperature source Oral, resp. rate 20, height 5' 6.6" (1.692 m), weight 134 lb 14.4 oz (61.2 kg), SpO2 100 %.  Wt Readings from Last 3 Encounters:  04/22/22 134 lb 14.4 oz (61.2 kg)  04/12/22 133 lb 1.9 oz (60.4 kg)  04/08/22 135 lb 12.8 oz (61.6 kg)    Body mass index is 21.38 kg/m.  Performance status (ECOG): 1 - Symptomatic but completely ambulatory  PHYSICAL EXAM:   GENERAL:alert, no distress and comfortable SKIN: skin color, texture, turgor are normal, no rashes or significant lesions EYES: normal, Conjunctiva are pink and non-injected, sclera clear OROPHARYNX:no exudate, no erythema and lips, buccal mucosa, and tongue normal  NECK: supple, thyroid normal size, non-tender, without nodularity LYMPH:  no palpable lymphadenopathy in the cervical, axillary or inguinal LUNGS: clear to auscultation and percussion with normal breathing effort HEART: regular rate & rhythm and no murmurs and no lower extremity edema ABDOMEN:abdomen soft, non-tender and normal bowel sounds Musculoskeletal:no cyanosis of digits and no clubbing  NEURO: alert & oriented x 3 with fluent speech, no focal motor/sensory  deficits  LABORATORY DATA:  I have reviewed the data as listed    Component Value Date/Time   NA 140 04/22/2022 0000   K 4.1 04/22/2022 0000   CL 102 04/22/2022 0000   CO2 30 (A) 04/22/2022 0000   GLUCOSE 99 06/26/2007 0933   BUN 10 04/22/2022 0000   CREATININE 0.7 04/22/2022 0000   CREATININE 0.68 06/26/2007 0933   CALCIUM 8.9 04/22/2022 0000   ALBUMIN 4.0 04/22/2022 0000   AST 22 04/22/2022 0000   ALT 18 04/22/2022 0000   ALKPHOS 73 04/22/2022 0000   GFRNONAA >60 06/26/2007 0933   GFRAA  06/26/2007 0933    >60        The eGFR has been calculated using the MDRD equation. This calculation has not been validated in all clinical    No results found for: SPEP, UPEP  Lab Results  Component Value Date   WBC 3.0 04/22/2022   NEUTROABS 2.01 04/22/2022   HGB 9.6 (A) 04/22/2022   HCT 29 (A) 04/22/2022   MCV 85 03/25/2022   PLT 127 (A) 04/22/2022      Chemistry  Component Value Date/Time   NA 140 04/22/2022 0000   K 4.1 04/22/2022 0000   CL 102 04/22/2022 0000   CO2 30 (A) 04/22/2022 0000   BUN 10 04/22/2022 0000   CREATININE 0.7 04/22/2022 0000   CREATININE 0.68 06/26/2007 0933   GLU 91 04/22/2022 0000      Component Value Date/Time   CALCIUM 8.9 04/22/2022 0000   ALKPHOS 73 04/22/2022 0000   AST 22 04/22/2022 0000   ALT 18 04/22/2022 0000       RADIOGRAPHIC STUDIES: I have personally reviewed the radiological images as listed and agreed with the findings in the report. No results found.

## 2022-04-25 MED FILL — Paclitaxel IV Conc 300 MG/50ML (6 MG/ML): INTRAVENOUS | Qty: 49 | Status: AC

## 2022-04-25 MED FILL — Dexamethasone Sodium Phosphate Inj 100 MG/10ML: INTRAMUSCULAR | Qty: 1 | Status: AC

## 2022-04-26 ENCOUNTER — Inpatient Hospital Stay: Payer: Managed Care, Other (non HMO)

## 2022-04-26 VITALS — BP 122/74 | HR 74 | Temp 97.9°F | Resp 18 | Ht 66.6 in | Wt 134.0 lb

## 2022-04-26 DIAGNOSIS — Z5111 Encounter for antineoplastic chemotherapy: Secondary | ICD-10-CM | POA: Diagnosis not present

## 2022-04-26 DIAGNOSIS — C50112 Malignant neoplasm of central portion of left female breast: Secondary | ICD-10-CM

## 2022-04-26 MED ORDER — DIPHENHYDRAMINE HCL 50 MG/ML IJ SOLN
50.0000 mg | Freq: Once | INTRAMUSCULAR | Status: AC
  Administered 2022-04-26: 50 mg via INTRAVENOUS
  Filled 2022-04-26: qty 1

## 2022-04-26 MED ORDER — SODIUM CHLORIDE 0.9 % IV SOLN
10.0000 mg | Freq: Once | INTRAVENOUS | Status: AC
  Administered 2022-04-26: 10 mg via INTRAVENOUS
  Filled 2022-04-26: qty 10

## 2022-04-26 MED ORDER — HEPARIN SOD (PORK) LOCK FLUSH 100 UNIT/ML IV SOLN
500.0000 [IU] | Freq: Once | INTRAVENOUS | Status: AC | PRN
  Administered 2022-04-26: 500 [IU]

## 2022-04-26 MED ORDER — FAMOTIDINE IN NACL 20-0.9 MG/50ML-% IV SOLN
20.0000 mg | Freq: Once | INTRAVENOUS | Status: AC
  Administered 2022-04-26: 20 mg via INTRAVENOUS
  Filled 2022-04-26: qty 50

## 2022-04-26 MED ORDER — SODIUM CHLORIDE 0.9 % IV SOLN
175.0000 mg/m2 | Freq: Once | INTRAVENOUS | Status: AC
  Administered 2022-04-26: 294 mg via INTRAVENOUS
  Filled 2022-04-26: qty 49

## 2022-04-26 MED ORDER — SODIUM CHLORIDE 0.9% FLUSH
10.0000 mL | INTRAVENOUS | Status: DC | PRN
  Administered 2022-04-26: 10 mL

## 2022-04-26 MED ORDER — SODIUM CHLORIDE 0.9 % IV SOLN: Freq: Once | INTRAVENOUS | Status: AC

## 2022-04-26 NOTE — Patient Instructions (Signed)
Paclitaxel injection What is this medication? PACLITAXEL (PAK li TAX el) is a chemotherapy drug. It targets fast dividing cells, like cancer cells, and causes these cells to die. This medicine is used to treat ovarian cancer, breast cancer, lung cancer, Kaposi's sarcoma, and other cancers. This medicine may be used for other purposes; ask your health care provider or pharmacist if you have questions. COMMON BRAND NAME(S): Onxol, Taxol What should I tell my care team before I take this medication? They need to know if you have any of these conditions: history of irregular heartbeat liver disease low blood counts, like low white cell, platelet, or red cell counts lung or breathing disease, like asthma tingling of the fingers or toes, or other nerve disorder an unusual or allergic reaction to paclitaxel, alcohol, polyoxyethylated castor oil, other chemotherapy, other medicines, foods, dyes, or preservatives pregnant or trying to get pregnant breast-feeding How should I use this medication? This drug is given as an infusion into a vein. It is administered in a hospital or clinic by a specially trained health care professional. Talk to your pediatrician regarding the use of this medicine in children. Special care may be needed. Overdosage: If you think you have taken too much of this medicine contact a poison control center or emergency room at once. NOTE: This medicine is only for you. Do not share this medicine with others. What if I miss a dose? It is important not to miss your dose. Call your doctor or health care professional if you are unable to keep an appointment. What may interact with this medication? Do not take this medicine with any of the following medications: live virus vaccines This medicine may also interact with the following medications: antiviral medicines for hepatitis, HIV or AIDS certain antibiotics like erythromycin and clarithromycin certain medicines for fungal  infections like ketoconazole and itraconazole certain medicines for seizures like carbamazepine, phenobarbital, phenytoin gemfibrozil nefazodone rifampin [place]. [place]'s wort This list may not describe all possible interactions. Give your health care provider a list of all the medicines, herbs, non-prescription drugs, or dietary supplements you use. Also tell them if you smoke, drink alcohol, or use illegal drugs. Some items may interact with your medicine. What should I watch for while using this medication? Your condition will be monitored carefully while you are receiving this medicine. You will need important blood work done while you are taking this medicine. This medicine can cause serious allergic reactions. To reduce your risk you will need to take other medicine(s) before treatment with this medicine. If you experience allergic reactions like skin rash, itching or hives, swelling of the face, lips, or tongue, tell your doctor or health care professional right away. In some cases, you may be given additional medicines to help with side effects. Follow all directions for their use. This drug may make you feel generally unwell. This is not uncommon, as chemotherapy can affect healthy cells as well as cancer cells. Report any side effects. Continue your course of treatment even though you feel ill unless your doctor tells you to stop. Call your doctor or health care professional for advice if you get a fever, chills or sore throat, or other symptoms of a cold or flu. Do not treat yourself. This drug decreases your body's ability to fight infections. Try to avoid being around people who are sick. This medicine may increase your risk to bruise or bleed. Call your doctor or health care professional if you notice any unusual bleeding. Be careful brushing  and flossing your teeth or using a toothpick because you may get an infection or bleed more easily. If you have any dental work done, tell your dentist  you are receiving this medicine. Avoid taking products that contain aspirin, acetaminophen, ibuprofen, naproxen, or ketoprofen unless instructed by your doctor. These medicines may hide a fever. Do not become pregnant while taking this medicine. Women should inform their doctor if they wish to become pregnant or think they might be pregnant. There is a potential for serious side effects to an unborn child. Talk to your health care professional or pharmacist for more information. Do not breast-feed an infant while taking this medicine. Men are advised not to father a child while receiving this medicine. This product may contain alcohol. Ask your pharmacist or healthcare provider if this medicine contains alcohol. Be sure to tell all healthcare providers you are taking this medicine. Certain medicines, like metronidazole and disulfiram, can cause an unpleasant reaction when taken with alcohol. The reaction includes flushing, headache, nausea, vomiting, sweating, and increased thirst. The reaction can last from 30 minutes to several hours. What side effects may I notice from receiving this medication? Side effects that you should report to your doctor or health care professional as soon as possible: allergic reactions like skin rash, itching or hives, swelling of the face, lips, or tongue breathing problems changes in vision fast, irregular heartbeat high or low blood pressure mouth sores pain, tingling, numbness in the hands or feet signs of decreased platelets or bleeding - bruising, pinpoint red spots on the skin, black, tarry stools, blood in the urine signs of decreased red blood cells - unusually weak or tired, feeling faint or lightheaded, falls signs of infection - fever or chills, cough, sore throat, pain or difficulty passing urine signs and symptoms of liver injury like dark yellow or brown urine; general ill feeling or flu-like symptoms; light-colored stools; loss of appetite; nausea; right  upper belly pain; unusually weak or tired; yellowing of the eyes or skin swelling of the ankles, feet, hands unusually slow heartbeat Side effects that usually do not require medical attention (report to your doctor or health care professional if they continue or are bothersome): diarrhea hair loss loss of appetite muscle or joint pain nausea, vomiting pain, redness, or irritation at site where injected tiredness This list may not describe all possible side effects. Call your doctor for medical advice about side effects. You may report side effects to FDA at 1-800-FDA-1088. Where should I keep my medication? This drug is given in a hospital or clinic and will not be stored at home. NOTE: This sheet is a summary. It may not cover all possible information. If you have questions about this medicine, talk to your doctor, pharmacist, or health care provider.  2023 Elsevier/Gold Standard (2021-10-22 00:00:00) 

## 2022-04-27 ENCOUNTER — Telehealth: Payer: Self-pay

## 2022-04-27 ENCOUNTER — Ambulatory Visit: Payer: Managed Care, Other (non HMO)

## 2022-04-27 NOTE — Telephone Encounter (Signed)
I called pt to see how she tolerated her first infusion of the Taxol. She states, "Everything went smooth". Pt denies N/V, diarrhea, skin rashes/itching and fever. I reminded pt to call us if she were to develop temp of 100.4 or higher, day or night. She verbalized understanding. I also confirmed her next appt.

## 2022-05-05 NOTE — Progress Notes (Signed)
[facility]  [address] [place],    [address] [phone]  Clinic Day:  05/06/2022  Referring physician: Gweneth Fritter, FNP  HISTORY OF PRESENT ILLNESS:  The patient is a 66 y.o. female with stage IIIA hormone positive breast cancer.  The patient is currently undergoing neoadjuvant chemotherapy.  She comes in today to be evaluated before heading into her 2nd cycle of dose dense Paclitaxel.  The patient claims to have tolerated her 1st cycle of dose dense paclitaxel fairly well.  She denies having any significant side effects from her chemotherapy. She continues to notice an improvement in her left breast since her neoadjuvant chemotherapy was started.  PHYSICAL EXAM:  Blood pressure 126/60, pulse 88, temperature 98.6 F (37 C), resp. rate 14, height 5' 6.6" (1.692 m), weight 135 lb 12.8 oz (61.6 kg), SpO2 97 %. Wt Readings from Last 3 Encounters:  05/06/22 135 lb 12.8 oz (61.6 kg)  04/26/22 134 lb (60.8 kg)  04/22/22 134 lb 14.4 oz (61.2 kg)   Body mass index is 21.53 kg/m. Performance status (ECOG): 0 - Asymptomatic Physical Exam Constitutional:      Appearance: Normal appearance.  HENT:     Mouth/Throat:     Pharynx: Oropharynx is clear. No oropharyngeal exudate.  Cardiovascular:     Rate and Rhythm: Normal rate and regular rhythm.     Heart sounds: No murmur heard.   No friction rub. No gallop.  Pulmonary:     Breath sounds: Normal breath sounds.  Chest:  Breasts:    Right: Normal. No swelling, bleeding, inverted nipple, mass, nipple discharge or skin change.     Left: Inverted nipple (less nipple inversion seen) and mass (mass is stable 5.5 cm, but much softer and incospicuous versus previously) present. No swelling, bleeding, nipple discharge or skin change.  Abdominal:     General: Bowel sounds are normal. There is no distension.     Palpations: Abdomen is soft. There is no mass.     Tenderness: There is no abdominal  tenderness.  Musculoskeletal:        General: No tenderness.     Cervical back: Normal range of motion and neck supple.     Right lower leg: No edema.     Left lower leg: No edema.  Lymphadenopathy:     Cervical: No cervical adenopathy.     Right cervical: No superficial, deep or posterior cervical adenopathy.    Left cervical: No superficial, deep or posterior cervical adenopathy.     Upper Body:     Right upper body: No supraclavicular or axillary adenopathy.     Left upper body: No supraclavicular or axillary (no longer palpable) adenopathy.     Lower Body: No right inguinal adenopathy. No left inguinal adenopathy.  Skin:    Coloration: Skin is not jaundiced.     Findings: No lesion or rash.  Neurological:     General: No focal deficit present.     Mental Status: She is alert and oriented to person, place, and time. Mental status is at baseline.  Psychiatric:        Mood and Affect: Mood normal.        Behavior: Behavior normal.        Thought Content: Thought content normal.        Judgment: Judgment normal.    LABS:    Latest Reference Range & Units 05/06/22 00:00  Sodium 137 - 147  140 (E)  Potassium  3.5 - 5.1 mEq/L 3.9 (E)  Chloride 99 - 108  104 (E)  CO2 13 - 22  31 ! (E)  Glucose  130 (E)  BUN 4 - 21  10 (E)  Creatinine 0.5 - 1.1  0.6 (E)  Calcium 8.7 - 10.7  8.6 ! (E)  Alkaline Phosphatase 25 - 125  109 (E)  Albumin 3.5 - 5.0  4.0 (E)  AST 13 - 35  33 (E)  ALT 7 - 35 U/L 38 ! (E)  Bilirubin, Total  0.5 (E)  WBC  15.5 (E)  RBC 3.87 - 5.11  3.17 ! (E)  Hemoglobin 12.0 - 16.0  9.9 ! (E)  HCT 36 - 46  30 ! (E)  Platelets 150 - 400 K/uL 173 (E)  NEUT#  14.73 (E)  !: Data is abnormal (E): External lab result  ASSESSMENT & PLAN:  A 66 y.o. female with stage IIIA hormone positive breast cancer.  She will proceed with her 2nd cycle of dose dense paclitaxel early next week.  She will continue to receive white cell shot therapy after each cycle of treatment to  prevent severe neutropenia from delaying successive cycles of treatment.  Clinically, the patient is doing very well.  I will see her back in 2 weeks before she heads into her 3rd cycle of dose dense paclitaxel.  The patient understands all the plans discussed today and is in agreement with them.   Alexandra Tranchina Macarthur Critchley, MD

## 2022-05-06 ENCOUNTER — Inpatient Hospital Stay: Payer: Managed Care, Other (non HMO) | Attending: Oncology

## 2022-05-06 ENCOUNTER — Inpatient Hospital Stay (INDEPENDENT_AMBULATORY_CARE_PROVIDER_SITE_OTHER): Payer: Managed Care, Other (non HMO) | Admitting: Oncology

## 2022-05-06 ENCOUNTER — Other Ambulatory Visit: Payer: Self-pay | Admitting: Oncology

## 2022-05-06 VITALS — BP 126/60 | HR 88 | Temp 98.6°F | Resp 14 | Ht 66.6 in | Wt 135.8 lb

## 2022-05-06 DIAGNOSIS — Z170 Estrogen receptor positive status [ER+]: Secondary | ICD-10-CM

## 2022-05-06 DIAGNOSIS — C50411 Malignant neoplasm of upper-outer quadrant of right female breast: Secondary | ICD-10-CM | POA: Diagnosis not present

## 2022-05-06 DIAGNOSIS — Z5111 Encounter for antineoplastic chemotherapy: Secondary | ICD-10-CM | POA: Insufficient documentation

## 2022-05-06 DIAGNOSIS — C50919 Malignant neoplasm of unspecified site of unspecified female breast: Secondary | ICD-10-CM | POA: Insufficient documentation

## 2022-05-06 LAB — CBC AND DIFFERENTIAL
HCT: 30 — AB (ref 36–46)
Hemoglobin: 9.9 — AB (ref 12.0–16.0)
Neutrophils Absolute: 14.73
Platelets: 173 10*3/uL (ref 150–400)
WBC: 15.5

## 2022-05-06 LAB — HEPATIC FUNCTION PANEL
ALT: 38 U/L — AB (ref 7–35)
AST: 33 (ref 13–35)
Alkaline Phosphatase: 109 (ref 25–125)
Bilirubin, Total: 0.5

## 2022-05-06 LAB — CBC: RBC: 3.17 — AB (ref 3.87–5.11)

## 2022-05-06 LAB — COMPREHENSIVE METABOLIC PANEL
Albumin: 4 (ref 3.5–5.0)
Calcium: 8.6 — AB (ref 8.7–10.7)

## 2022-05-06 LAB — BASIC METABOLIC PANEL
BUN: 10 (ref 4–21)
CO2: 31 — AB (ref 13–22)
Chloride: 104 (ref 99–108)
Creatinine: 0.6 (ref 0.5–1.1)
Glucose: 130
Potassium: 3.9 mEq/L (ref 3.5–5.1)
Sodium: 140 (ref 137–147)

## 2022-05-06 NOTE — Progress Notes (Signed)
Face to face visit with pt in [place]. Pt is here for Med Onc follow up. Pt reports that she is doing well with chemo. Pt reports that her only real side effects have been some achiness. She has completed 5 of her 8 treatments and surgery will be the next step. Encouraged pt to call with questions or concerns.

## 2022-05-09 MED FILL — Dexamethasone Sodium Phosphate Inj 100 MG/10ML: INTRAMUSCULAR | Qty: 1 | Status: AC

## 2022-05-09 MED FILL — Paclitaxel IV Conc 300 MG/50ML (6 MG/ML): INTRAVENOUS | Qty: 49 | Status: AC

## 2022-05-10 ENCOUNTER — Inpatient Hospital Stay: Payer: Managed Care, Other (non HMO)

## 2022-05-10 VITALS — BP 132/60 | HR 83 | Temp 98.1°F | Resp 18 | Ht 66.6 in | Wt 134.0 lb

## 2022-05-10 DIAGNOSIS — C50112 Malignant neoplasm of central portion of left female breast: Secondary | ICD-10-CM

## 2022-05-10 DIAGNOSIS — C50919 Malignant neoplasm of unspecified site of unspecified female breast: Secondary | ICD-10-CM | POA: Diagnosis present

## 2022-05-10 DIAGNOSIS — Z5111 Encounter for antineoplastic chemotherapy: Secondary | ICD-10-CM | POA: Diagnosis not present

## 2022-05-10 MED ORDER — HEPARIN SOD (PORK) LOCK FLUSH 100 UNIT/ML IV SOLN: 500.0000 [IU] | Freq: Once | INTRAVENOUS | Status: DC | PRN

## 2022-05-10 MED ORDER — SODIUM CHLORIDE 0.9 % IV SOLN: Freq: Once | INTRAVENOUS | Status: AC

## 2022-05-10 MED ORDER — SODIUM CHLORIDE 0.9 % IV SOLN
175.0000 mg/m2 | Freq: Once | INTRAVENOUS | Status: AC
  Administered 2022-05-10: 294 mg via INTRAVENOUS
  Filled 2022-05-10: qty 49

## 2022-05-10 MED ORDER — SODIUM CHLORIDE 0.9 % IV SOLN
10.0000 mg | Freq: Once | INTRAVENOUS | Status: AC
  Administered 2022-05-10: 10 mg via INTRAVENOUS
  Filled 2022-05-10: qty 10

## 2022-05-10 MED ORDER — FAMOTIDINE IN NACL 20-0.9 MG/50ML-% IV SOLN
20.0000 mg | Freq: Once | INTRAVENOUS | Status: AC
  Administered 2022-05-10: 20 mg via INTRAVENOUS
  Filled 2022-05-10: qty 50

## 2022-05-10 MED ORDER — DIPHENHYDRAMINE HCL 50 MG/ML IJ SOLN
50.0000 mg | Freq: Once | INTRAMUSCULAR | Status: AC
  Administered 2022-05-10: 50 mg via INTRAVENOUS
  Filled 2022-05-10: qty 1

## 2022-05-10 MED ORDER — SODIUM CHLORIDE 0.9% FLUSH: 10.0000 mL | INTRAVENOUS | Status: DC | PRN

## 2022-05-10 NOTE — Patient Instructions (Signed)
Paclitaxel injection What is this medication? PACLITAXEL (PAK li TAX el) is a chemotherapy drug. It targets fast dividing cells, like cancer cells, and causes these cells to die. This medicine is used to treat ovarian cancer, breast cancer, lung cancer, Kaposi's sarcoma, and other cancers. This medicine may be used for other purposes; ask your health care provider or pharmacist if you have questions. COMMON BRAND NAME(S): Onxol, Taxol What should I tell my care team before I take this medication? They need to know if you have any of these conditions: history of irregular heartbeat liver disease low blood counts, like low white cell, platelet, or red cell counts lung or breathing disease, like asthma tingling of the fingers or toes, or other nerve disorder an unusual or allergic reaction to paclitaxel, alcohol, polyoxyethylated castor oil, other chemotherapy, other medicines, foods, dyes, or preservatives pregnant or trying to get pregnant breast-feeding How should I use this medication? This drug is given as an infusion into a vein. It is administered in a hospital or clinic by a specially trained health care professional. Talk to your pediatrician regarding the use of this medicine in children. Special care may be needed. Overdosage: If you think you have taken too much of this medicine contact a poison control center or emergency room at once. NOTE: This medicine is only for you. Do not share this medicine with others. What if I miss a dose? It is important not to miss your dose. Call your doctor or health care professional if you are unable to keep an appointment. What may interact with this medication? Do not take this medicine with any of the following medications: live virus vaccines This medicine may also interact with the following medications: antiviral medicines for hepatitis, HIV or AIDS certain antibiotics like erythromycin and clarithromycin certain medicines for fungal  infections like ketoconazole and itraconazole certain medicines for seizures like carbamazepine, phenobarbital, phenytoin gemfibrozil nefazodone rifampin [place]. [place]'s wort This list may not describe all possible interactions. Give your health care provider a list of all the medicines, herbs, non-prescription drugs, or dietary supplements you use. Also tell them if you smoke, drink alcohol, or use illegal drugs. Some items may interact with your medicine. What should I watch for while using this medication? Your condition will be monitored carefully while you are receiving this medicine. You will need important blood work done while you are taking this medicine. This medicine can cause serious allergic reactions. To reduce your risk you will need to take other medicine(s) before treatment with this medicine. If you experience allergic reactions like skin rash, itching or hives, swelling of the face, lips, or tongue, tell your doctor or health care professional right away. In some cases, you may be given additional medicines to help with side effects. Follow all directions for their use. This drug may make you feel generally unwell. This is not uncommon, as chemotherapy can affect healthy cells as well as cancer cells. Report any side effects. Continue your course of treatment even though you feel ill unless your doctor tells you to stop. Call your doctor or health care professional for advice if you get a fever, chills or sore throat, or other symptoms of a cold or flu. Do not treat yourself. This drug decreases your body's ability to fight infections. Try to avoid being around people who are sick. This medicine may increase your risk to bruise or bleed. Call your doctor or health care professional if you notice any unusual bleeding. Be careful brushing  and flossing your teeth or using a toothpick because you may get an infection or bleed more easily. If you have any dental work done, tell your dentist  you are receiving this medicine. Avoid taking products that contain aspirin, acetaminophen, ibuprofen, naproxen, or ketoprofen unless instructed by your doctor. These medicines may hide a fever. Do not become pregnant while taking this medicine. Women should inform their doctor if they wish to become pregnant or think they might be pregnant. There is a potential for serious side effects to an unborn child. Talk to your health care professional or pharmacist for more information. Do not breast-feed an infant while taking this medicine. Men are advised not to father a child while receiving this medicine. This product may contain alcohol. Ask your pharmacist or healthcare provider if this medicine contains alcohol. Be sure to tell all healthcare providers you are taking this medicine. Certain medicines, like metronidazole and disulfiram, can cause an unpleasant reaction when taken with alcohol. The reaction includes flushing, headache, nausea, vomiting, sweating, and increased thirst. The reaction can last from 30 minutes to several hours. What side effects may I notice from receiving this medication? Side effects that you should report to your doctor or health care professional as soon as possible: allergic reactions like skin rash, itching or hives, swelling of the face, lips, or tongue breathing problems changes in vision fast, irregular heartbeat high or low blood pressure mouth sores pain, tingling, numbness in the hands or feet signs of decreased platelets or bleeding - bruising, pinpoint red spots on the skin, black, tarry stools, blood in the urine signs of decreased red blood cells - unusually weak or tired, feeling faint or lightheaded, falls signs of infection - fever or chills, cough, sore throat, pain or difficulty passing urine signs and symptoms of liver injury like dark yellow or brown urine; general ill feeling or flu-like symptoms; light-colored stools; loss of appetite; nausea; right  upper belly pain; unusually weak or tired; yellowing of the eyes or skin swelling of the ankles, feet, hands unusually slow heartbeat Side effects that usually do not require medical attention (report to your doctor or health care professional if they continue or are bothersome): diarrhea hair loss loss of appetite muscle or joint pain nausea, vomiting pain, redness, or irritation at site where injected tiredness This list may not describe all possible side effects. Call your doctor for medical advice about side effects. You may report side effects to FDA at 1-800-FDA-1088. Where should I keep my medication? This drug is given in a hospital or clinic and will not be stored at home. NOTE: This sheet is a summary. It may not cover all possible information. If you have questions about this medicine, talk to your doctor, pharmacist, or health care provider.  2023 Elsevier/Gold Standard (2021-10-22 00:00:00) 

## 2022-05-19 NOTE — Progress Notes (Signed)
[facility]  [address] [place],  [place]  [address] [phone]  Clinic Day:  05/06/2022  Referring physician: Gweneth Fritter, FNP  HISTORY OF PRESENT ILLNESS:  The patient is a 66 y.o. female with stage IIIA hormone positive breast cancer.  The patient is currently undergoing neoadjuvant chemotherapy.  She comes in today to be evaluated before heading into her 3rd cycle of dose dense Paclitaxel.  The patient claims to have tolerated her 2nd cycle of dose dense paclitaxel fairly well.  She denies having any significant side effects from her chemotherapy. She continues to notice an improvement in her left breast since her neoadjuvant chemotherapy was started.  PHYSICAL EXAM:  There were no vitals taken for this visit. Wt Readings from Last 3 Encounters:  05/10/22 134 lb (60.8 kg)  05/06/22 135 lb 12.8 oz (61.6 kg)  04/26/22 134 lb (60.8 kg)   There is no height or weight on file to calculate BMI. Performance status (ECOG): 0 - Asymptomatic Physical Exam Constitutional:      Appearance: Normal appearance.  HENT:     Mouth/Throat:     Pharynx: Oropharynx is clear. No oropharyngeal exudate.  Cardiovascular:     Rate and Rhythm: Normal rate and regular rhythm.     Heart sounds: No murmur heard.    No friction rub. No gallop.  Pulmonary:     Breath sounds: Normal breath sounds.  Chest:  Breasts:    Right: Normal. No swelling, bleeding, inverted nipple, mass, nipple discharge or skin change.     Left: Mass (mass is no longer palpable; minimal fullness felt) present. No swelling, bleeding, inverted nipple (No further nipple inversion seen), nipple discharge or skin change.  Abdominal:     General: Bowel sounds are normal. There is no distension.     Palpations: Abdomen is soft. There is no mass.     Tenderness: There is no abdominal tenderness.  Musculoskeletal:        General: No tenderness.     Cervical back: Normal range of motion and neck  supple.     Right lower leg: No edema.     Left lower leg: No edema.  Lymphadenopathy:     Cervical: No cervical adenopathy.     Right cervical: No superficial, deep or posterior cervical adenopathy.    Left cervical: No superficial, deep or posterior cervical adenopathy.     Upper Body:     Right upper body: No supraclavicular or axillary adenopathy.     Left upper body: No supraclavicular or axillary (no longer palpable) adenopathy.     Lower Body: No right inguinal adenopathy. No left inguinal adenopathy.  Skin:    Coloration: Skin is not jaundiced.     Findings: No lesion or rash.  Neurological:     General: No focal deficit present.     Mental Status: She is alert and oriented to person, place, and time. Mental status is at baseline.  Psychiatric:        Mood and Affect: Mood normal.        Behavior: Behavior normal.        Thought Content: Thought content normal.        Judgment: Judgment normal.    LABS:    Latest Reference Range & Units 05/06/22 00:00  Sodium 137 - 147  140 (E)  Potassium 3.5 - 5.1 mEq/L 3.9 (E)  Chloride 99 - 108  104 (E)  CO2 13 - 22  31 ! (E)  Glucose  130 (E)  BUN 4 - 21  10 (E)  Creatinine 0.5 - 1.1  0.6 (E)  Calcium 8.7 - 10.7  8.6 ! (E)  Alkaline Phosphatase 25 - 125  109 (E)  Albumin 3.5 - 5.0  4.0 (E)  AST 13 - 35  33 (E)  ALT 7 - 35 U/L 38 ! (E)  Bilirubin, Total  0.5 (E)  WBC  15.5 (E)  RBC 3.87 - 5.11  3.17 ! (E)  Hemoglobin 12.0 - 16.0  9.9 ! (E)  HCT 36 - 46  30 ! (E)  Platelets 150 - 400 K/uL 173 (E)  NEUT#  14.73 (E)  !: Data is abnormal (E): External lab result  ASSESSMENT & PLAN:  A 66 y.o. female with stage IIIA hormone positive breast cancer.  She will proceed with her 2nd cycle of dose dense paclitaxel early next week.  She will continue to receive white cell shot therapy after each cycle of treatment to prevent severe neutropenia from delaying successive cycles of treatment.  Clinically, the patient is doing very well.   I will see her back in 2 weeks before she heads into her 3rd cycle of dose dense paclitaxel.  The patient understands all the plans discussed today and is in agreement with them.   Cyprian Gongaware Macarthur Critchley, MD

## 2022-05-20 ENCOUNTER — Inpatient Hospital Stay: Payer: Managed Care, Other (non HMO)

## 2022-05-20 ENCOUNTER — Inpatient Hospital Stay (INDEPENDENT_AMBULATORY_CARE_PROVIDER_SITE_OTHER): Payer: Managed Care, Other (non HMO) | Admitting: Oncology

## 2022-05-20 ENCOUNTER — Other Ambulatory Visit: Payer: Self-pay

## 2022-05-20 VITALS — BP 133/68 | HR 81 | Temp 98.3°F | Resp 18 | Ht 66.6 in | Wt 136.4 lb

## 2022-05-20 DIAGNOSIS — C50411 Malignant neoplasm of upper-outer quadrant of right female breast: Secondary | ICD-10-CM

## 2022-05-20 DIAGNOSIS — Z170 Estrogen receptor positive status [ER+]: Secondary | ICD-10-CM | POA: Diagnosis not present

## 2022-05-20 LAB — CBC: RBC: 2.97 — AB (ref 3.87–5.11)

## 2022-05-20 LAB — COMPREHENSIVE METABOLIC PANEL
Albumin: 4 (ref 3.5–5.0)
Calcium: 8.7 (ref 8.7–10.7)

## 2022-05-20 LAB — CBC AND DIFFERENTIAL
HCT: 29 — AB (ref 36–46)
Hemoglobin: 9.2 — AB (ref 12.0–16.0)
Neutrophils Absolute: 9.13
Platelets: 219 10*3/uL (ref 150–400)
WBC: 11

## 2022-05-20 LAB — BASIC METABOLIC PANEL
BUN: 14 (ref 4–21)
CO2: 26 — AB (ref 13–22)
Chloride: 103 (ref 99–108)
Creatinine: 0.6 (ref 0.5–1.1)
Glucose: 96
Potassium: 4 mEq/L (ref 3.5–5.1)
Sodium: 139 (ref 137–147)

## 2022-05-20 LAB — HEPATIC FUNCTION PANEL
ALT: 33 U/L (ref 7–35)
AST: 30 (ref 13–35)
Alkaline Phosphatase: 93 (ref 25–125)
Bilirubin, Total: 0.5

## 2022-05-23 ENCOUNTER — Encounter: Payer: Self-pay | Admitting: Oncology

## 2022-05-23 MED FILL — Dexamethasone Sodium Phosphate Inj 100 MG/10ML: INTRAMUSCULAR | Qty: 1 | Status: AC

## 2022-05-23 MED FILL — Paclitaxel IV Conc 300 MG/50ML (6 MG/ML): INTRAVENOUS | Qty: 49 | Status: AC

## 2022-05-24 ENCOUNTER — Inpatient Hospital Stay: Payer: Managed Care, Other (non HMO)

## 2022-05-24 VITALS — BP 137/64 | HR 87 | Temp 98.1°F | Resp 16 | Wt 136.0 lb

## 2022-05-24 DIAGNOSIS — Z5111 Encounter for antineoplastic chemotherapy: Secondary | ICD-10-CM | POA: Diagnosis not present

## 2022-05-24 DIAGNOSIS — Z170 Estrogen receptor positive status [ER+]: Secondary | ICD-10-CM

## 2022-05-24 MED ORDER — SODIUM CHLORIDE 0.9 % IV SOLN: Freq: Once | INTRAVENOUS | Status: AC

## 2022-05-24 MED ORDER — DIPHENHYDRAMINE HCL 50 MG/ML IJ SOLN
50.0000 mg | Freq: Once | INTRAMUSCULAR | Status: AC
  Administered 2022-05-24: 50 mg via INTRAVENOUS
  Filled 2022-05-24: qty 1

## 2022-05-24 MED ORDER — SODIUM CHLORIDE 0.9 % IV SOLN
10.0000 mg | Freq: Once | INTRAVENOUS | Status: AC
  Administered 2022-05-24: 10 mg via INTRAVENOUS
  Filled 2022-05-24: qty 10

## 2022-05-24 MED ORDER — FAMOTIDINE IN NACL 20-0.9 MG/50ML-% IV SOLN
20.0000 mg | Freq: Once | INTRAVENOUS | Status: AC
  Administered 2022-05-24: 20 mg via INTRAVENOUS
  Filled 2022-05-24: qty 50

## 2022-05-24 MED ORDER — SODIUM CHLORIDE 0.9% FLUSH
10.0000 mL | INTRAVENOUS | Status: DC | PRN
  Administered 2022-05-24: 10 mL

## 2022-05-24 MED ORDER — HEPARIN SOD (PORK) LOCK FLUSH 100 UNIT/ML IV SOLN
500.0000 [IU] | Freq: Once | INTRAVENOUS | Status: AC | PRN
  Administered 2022-05-24: 500 [IU]

## 2022-05-24 MED ORDER — SODIUM CHLORIDE 0.9 % IV SOLN
175.0000 mg/m2 | Freq: Once | INTRAVENOUS | Status: AC
  Administered 2022-05-24: 294 mg via INTRAVENOUS
  Filled 2022-05-24: qty 49

## 2022-05-24 NOTE — Progress Notes (Signed)
Patient reports that she has an appt for home Pegfilgrastin this week

## 2022-05-24 NOTE — Patient Instructions (Signed)
[facility] [place]  Discharge Instructions: Thank you for choosing [facility] to provide your oncology and hematology care.  If you have a lab appointment with the Cancer Center, please go directly to the Cancer Center and check in at the registration area.   Wear comfortable clothing and clothing appropriate for easy access to any Portacath or PICC line.   We strive to give you quality time with your provider. You may need to reschedule your appointment if you arrive late (15 or more minutes).  Arriving late affects you and other patients whose appointments are after yours.  Also, if you miss three or more appointments without notifying the office, you may be dismissed from the clinic at the provider's discretion.      For prescription refill requests, have your pharmacy contact our office and allow 72 hours for refills to be completed.    Today you received the following chemotherapy and/or immunotherapy agents TaxolPaclitaxel injection What is this medication? PACLITAXEL (PAK li TAX el) is a chemotherapy drug. It targets fast dividing cells, like cancer cells, and causes these cells to die. This medicine is used to treat ovarian cancer, breast cancer, lung cancer, Kaposi's sarcoma, and other cancers. This medicine may be used for other purposes; ask your health care provider or pharmacist if you have questions. COMMON BRAND NAME(S): Onxol, Taxol What should I tell my care team before I take this medication? They need to know if you have any of these conditions: history of irregular heartbeat liver disease low blood counts, like low white cell, platelet, or red cell counts lung or breathing disease, like asthma tingling of the fingers or toes, or other nerve disorder an unusual or allergic reaction to paclitaxel, alcohol, polyoxyethylated castor oil, other chemotherapy, other medicines, foods, dyes, or preservatives pregnant or trying to get  pregnant breast-feeding How should I use this medication? This drug is given as an infusion into a vein. It is administered in a hospital or clinic by a specially trained health care professional. Talk to your pediatrician regarding the use of this medicine in children. Special care may be needed. Overdosage: If you think you have taken too much of this medicine contact a poison control center or emergency room at once. NOTE: This medicine is only for you. Do not share this medicine with others. What if I miss a dose? It is important not to miss your dose. Call your doctor or health care professional if you are unable to keep an appointment. What may interact with this medication? Do not take this medicine with any of the following medications: live virus vaccines This medicine may also interact with the following medications: antiviral medicines for hepatitis, HIV or AIDS certain antibiotics like erythromycin and clarithromycin certain medicines for fungal infections like ketoconazole and itraconazole certain medicines for seizures like carbamazepine, phenobarbital, phenytoin gemfibrozil nefazodone rifampin St. John's wort This list may not describe all possible interactions. Give your health care provider a list of all the medicines, herbs, non-prescription drugs, or dietary supplements you use. Also tell them if you smoke, drink alcohol, or use illegal drugs. Some items may interact with your medicine. What should I watch for while using this medication? Your condition will be monitored carefully while you are receiving this medicine. You will need important blood work done while you are taking this medicine. This medicine can cause serious allergic reactions. To reduce your risk you will need to take other medicine(s) before treatment with this   medicine. If you experience allergic reactions like skin rash, itching or hives, swelling of the face, lips, or tongue, tell your doctor or health  care professional right away. In some cases, you may be given additional medicines to help with side effects. Follow all directions for their use. This drug may make you feel generally unwell. This is not uncommon, as chemotherapy can affect healthy cells as well as cancer cells. Report any side effects. Continue your course of treatment even though you feel ill unless your doctor tells you to stop. Call your doctor or health care professional for advice if you get a fever, chills or sore throat, or other symptoms of a cold or flu. Do not treat yourself. This drug decreases your body's ability to fight infections. Try to avoid being around people who are sick. This medicine may increase your risk to bruise or bleed. Call your doctor or health care professional if you notice any unusual bleeding. Be careful brushing and flossing your teeth or using a toothpick because you may get an infection or bleed more easily. If you have any dental work done, tell your dentist you are receiving this medicine. Avoid taking products that contain aspirin, acetaminophen, ibuprofen, naproxen, or ketoprofen unless instructed by your doctor. These medicines may hide a fever. Do not become pregnant while taking this medicine. Women should inform their doctor if they wish to become pregnant or think they might be pregnant. There is a potential for serious side effects to an unborn child. Talk to your health care professional or pharmacist for more information. Do not breast-feed an infant while taking this medicine. Men are advised not to father a child while receiving this medicine. This product may contain alcohol. Ask your pharmacist or healthcare provider if this medicine contains alcohol. Be sure to tell all healthcare providers you are taking this medicine. Certain medicines, like metronidazole and disulfiram, can cause an unpleasant reaction when taken with alcohol. The reaction includes flushing, headache, nausea,  vomiting, sweating, and increased thirst. The reaction can last from 30 minutes to several hours. What side effects may I notice from receiving this medication? Side effects that you should report to your doctor or health care professional as soon as possible: allergic reactions like skin rash, itching or hives, swelling of the face, lips, or tongue breathing problems changes in vision fast, irregular heartbeat high or low blood pressure mouth sores pain, tingling, numbness in the hands or feet signs of decreased platelets or bleeding - bruising, pinpoint red spots on the skin, black, tarry stools, blood in the urine signs of decreased red blood cells - unusually weak or tired, feeling faint or lightheaded, falls signs of infection - fever or chills, cough, sore throat, pain or difficulty passing urine signs and symptoms of liver injury like dark yellow or brown urine; general ill feeling or flu-like symptoms; light-colored stools; loss of appetite; nausea; right upper belly pain; unusually weak or tired; yellowing of the eyes or skin swelling of the ankles, feet, hands unusually slow heartbeat Side effects that usually do not require medical attention (report to your doctor or health care professional if they continue or are bothersome): diarrhea hair loss loss of appetite muscle or joint pain nausea, vomiting pain, redness, or irritation at site where injected tiredness This list may not describe all possible side effects. Call your doctor for medical advice about side effects. You may report side effects to FDA at 1-800-FDA-1088. Where should I keep my medication? This drug is   given in a hospital or clinic and will not be stored at home. NOTE: This sheet is a summary. It may not cover all possible information. If you have questions about this medicine, talk to your doctor, pharmacist, or health care provider.  2023 Elsevier/Gold Standard (2021-10-22 00:00:00)       To help prevent  nausea and vomiting after your treatment, we encourage you to take your nausea medication as directed.  BELOW ARE SYMPTOMS THAT SHOULD BE REPORTED IMMEDIATELY: *FEVER GREATER THAN 100.4 F (38 C) OR HIGHER *CHILLS OR SWEATING *NAUSEA AND VOMITING THAT IS NOT CONTROLLED WITH YOUR NAUSEA MEDICATION *UNUSUAL SHORTNESS OF BREATH *UNUSUAL BRUISING OR BLEEDING *URINARY PROBLEMS (pain or burning when urinating, or frequent urination) *BOWEL PROBLEMS (unusual diarrhea, constipation, pain near the anus) TENDERNESS IN MOUTH AND THROAT WITH OR WITHOUT PRESENCE OF ULCERS (sore throat, sores in mouth, or a toothache) UNUSUAL RASH, SWELLING OR PAIN  UNUSUAL VAGINAL DISCHARGE OR ITCHING   Items with * indicate a potential emergency and should be followed up as soon as possible or go to the Emergency Department if any problems should occur.  Please show the CHEMOTHERAPY ALERT CARD or IMMUNOTHERAPY ALERT CARD at check-in to the Emergency Department and triage nurse.  Should you have questions after your visit or need to cancel or reschedule your appointment, please contact [facility]  Dept: [phone]  and follow the prompts.  Office hours are 8:00 a.m. to 4:30 p.m. Monday - Friday. Please note that voicemails left after 4:00 p.m. may not be returned until the following business day.  We are closed weekends and major holidays. You have access to a nurse at all times for urgent questions. Please call the main number to the clinic Dept: [phone] and follow the prompts.  For any non-urgent questions, you may also contact your provider using MyChart. We now offer e-Visits for anyone 18 and older to request care online for non-urgent symptoms. For details visit [email or web address].   Also download the MyChart app! Go to the app store, search "MyChart", open the app, select [facility], and log in with your MyChart username and password.  Masks are optional in the cancer centers.  If you would like for your care team to wear a mask while they are taking care of you, please let them know. For doctor visits, patients may have with them one support person who is at least 64 years old. At this time, visitors are not allowed in the infusion area.   

## 2022-05-25 ENCOUNTER — Encounter: Payer: Self-pay | Admitting: Oncology

## 2022-05-26 ENCOUNTER — Encounter: Payer: Self-pay | Admitting: Oncology

## 2022-05-27 ENCOUNTER — Encounter: Payer: Self-pay | Admitting: Oncology

## 2022-06-02 NOTE — Progress Notes (Cosign Needed)
[facility]  [address] [place],  [place]  [address] [phone]  Clinic Day:  06/03/2022  Referring physician: Gweneth Fritter, FNP   HISTORY OF PRESENT ILLNESS:  The patient is a 66 y.o. female with stage IIIA hormone positive breast cancer.  The patient is currently undergoing neoadjuvant chemotherapy.  She comes in today to be evaluated before heading into her 4th and final cycle of dose dense paclitaxel.  The patient claims to have tolerated her 3rd cycle of dose dense paclitaxel well, except for restless legs the night of her infusion.  She denies having any significant side effects from her chemotherapy. She continues to notice an improvement in her left breast since her neoadjuvant chemotherapy was started.   PHYSICAL EXAM:  Blood pressure 125/67, pulse 76, temperature 98.7 F (37.1 C), resp. rate 14, height 5' 6.6" (1.692 m), weight 135 lb 12.8 oz (61.6 kg), SpO2 98 %. Wt Readings from Last 3 Encounters:  06/03/22 135 lb 12.8 oz (61.6 kg)  05/24/22 136 lb (61.7 kg)  05/20/22 136 lb 6.4 oz (61.9 kg)   Body mass index is 21.53 kg/m.  Performance status (ECOG): 0 - Asymptomatic  Physical Exam Vitals and nursing note reviewed.  Constitutional:      General: She is not in acute distress.    Appearance: Normal appearance.  HENT:     Head: Normocephalic and atraumatic.     Mouth/Throat:     Mouth: Mucous membranes are moist.     Pharynx: Oropharynx is clear. No oropharyngeal exudate or posterior oropharyngeal erythema.  Eyes:     General: No scleral icterus.    Extraocular Movements: Extraocular movements intact.     Conjunctiva/sclera: Conjunctivae normal.     Pupils: Pupils are equal, round, and reactive to light.  Cardiovascular:     Rate and Rhythm: Normal rate and regular rhythm.     Heart sounds: Normal heart sounds. No murmur heard.    No friction rub. No gallop.  Pulmonary:     Effort: Pulmonary effort is normal.     Breath  sounds: Normal breath sounds. No wheezing, rhonchi or rales.  Chest:  Breasts:    Right: Normal.     Comments: Mild firmness in the left upper outer quadrant without discrete mass. Abdominal:     General: There is no distension.     Palpations: Abdomen is soft. There is no hepatomegaly, splenomegaly or mass.     Tenderness: There is no abdominal tenderness.  Musculoskeletal:        General: Normal range of motion.     Cervical back: Normal range of motion and neck supple. No tenderness.     Right lower leg: No edema.     Left lower leg: No edema.  Lymphadenopathy:     Cervical: No cervical adenopathy.     Upper Body:     Right upper body: No supraclavicular or axillary adenopathy.     Left upper body: No supraclavicular or axillary adenopathy.     Lower Body: No right inguinal adenopathy. No left inguinal adenopathy.  Skin:    General: Skin is warm and dry.     Coloration: Skin is not jaundiced.     Findings: No rash.  Neurological:     Mental Status: She is alert and oriented to person, place, and time.     Cranial Nerves: No cranial nerve deficit.  Psychiatric:        Mood and Affect: Mood normal.  Behavior: Behavior normal.        Thought Content: Thought content normal.     LABS:      Latest Ref Rng & Units 06/03/2022   12:00 AM 05/20/2022   12:00 AM 05/06/2022   12:00 AM  CBC  WBC  9.2     11.0     15.5      Hemoglobin 12.0 - 16.0 10.0     9.2     9.9      Hematocrit 36 - 46 '30     29     30      '$ Platelets 150 - 400 K/uL 194     219     173         This result is from an external source.      Latest Ref Rng & Units 06/03/2022   12:00 AM 05/20/2022   12:00 AM 05/06/2022   12:00 AM  CMP  BUN 4 - '21 9     14     10      '$ Creatinine 0.5 - 1.1 0.6     0.6     0.6      Sodium 137 - 147 139     139     140      Potassium 3.5 - 5.1 mEq/L 3.9     4.0     3.9      Chloride 99 - 108 105     103     104      CO2 13 - '22 29     26     31      '$ Calcium 8.7 - 10.7 8.8      8.7     8.6      Alkaline Phos 25 - 125 95     93     109      AST 13 - 35 30     30     33      ALT 7 - 35 U/L 30     33     38         This result is from an external source.     No results found for: "CEA1", "CEA" / No results found for: "CEA1", "CEA" No results found for: "PSA1" No results found for: "HEN277" No results found for: "CAN125"  No results found for: "TOTALPROTELP", "ALBUMINELP", "A1GS", "A2GS", "BETS", "BETA2SER", "GAMS", "MSPIKE", "SPEI" No results found for: "TIBC", "FERRITIN", "IRONPCTSAT" No results found for: "LDH"  No results found for: "AFPTUMOR", "TOTALPROTELP", "ALBUMINELP", "A1GS", "A2GS", "BETS", "BETA2SER", "GAMS", "MSPIKE", "SPEI", "LDH", "CEA1", "CEA", "PSA1", "IGASERUM", "IGGSERUM", "IGMSERUM", "THGAB", "THYROGLB"  Review Flowsheet        No data to display           STUDIES:  No results found.    ASSESSMENT & PLAN:   Assessment/Plan:  A 66 y.o. female with stage IIIA hormone receptor positive breast cancer.  She is receiving neoadjuvant chemotherapy.  She completed 4 cycles of dose dense Adriamycin/Cytoxan.  She will proceed with her fourth and final cycle of dose dense paclitaxel next week.  She has had an excellent clinical response to neoadjuvant chemotherapy.  We will plan to see her back in 3 weeks with a repeat MRI breast prior to surgery.  We will also schedule her to see Dr. Noberto Retort again.  The patient understands all the plans discussed today and is in agreement with them.  Marvia Pickles, PA-C

## 2022-06-03 ENCOUNTER — Encounter: Payer: Self-pay | Admitting: Hematology and Oncology

## 2022-06-03 ENCOUNTER — Inpatient Hospital Stay: Payer: Managed Care, Other (non HMO)

## 2022-06-03 ENCOUNTER — Inpatient Hospital Stay (INDEPENDENT_AMBULATORY_CARE_PROVIDER_SITE_OTHER): Payer: Managed Care, Other (non HMO) | Admitting: Hematology and Oncology

## 2022-06-03 VITALS — BP 125/67 | HR 76 | Temp 98.7°F | Resp 14 | Ht 66.6 in | Wt 135.8 lb

## 2022-06-03 DIAGNOSIS — C50412 Malignant neoplasm of upper-outer quadrant of left female breast: Secondary | ICD-10-CM | POA: Diagnosis not present

## 2022-06-03 DIAGNOSIS — C50112 Malignant neoplasm of central portion of left female breast: Secondary | ICD-10-CM

## 2022-06-03 DIAGNOSIS — Z170 Estrogen receptor positive status [ER+]: Secondary | ICD-10-CM

## 2022-06-03 LAB — CBC AND DIFFERENTIAL
HCT: 30 — AB (ref 36–46)
Hemoglobin: 10 — AB (ref 12.0–16.0)
Neutrophils Absolute: 7.54
Platelets: 194 10*3/uL (ref 150–400)
WBC: 9.2

## 2022-06-03 LAB — BASIC METABOLIC PANEL
BUN: 9 (ref 4–21)
CO2: 29 — AB (ref 13–22)
Chloride: 105 (ref 99–108)
Creatinine: 0.6 (ref 0.5–1.1)
Glucose: 106
Potassium: 3.9 mEq/L (ref 3.5–5.1)
Sodium: 139 (ref 137–147)

## 2022-06-03 LAB — CBC
MCV: 99 (ref 81–99)
RBC: 3.05 — AB (ref 3.87–5.11)

## 2022-06-03 LAB — HEPATIC FUNCTION PANEL
ALT: 30 U/L (ref 7–35)
AST: 30 (ref 13–35)
Alkaline Phosphatase: 95 (ref 25–125)
Bilirubin, Total: 0.5

## 2022-06-03 LAB — COMPREHENSIVE METABOLIC PANEL
Albumin: 4 (ref 3.5–5.0)
Calcium: 8.8 (ref 8.7–10.7)

## 2022-06-03 MED FILL — Dexamethasone Sodium Phosphate Inj 100 MG/10ML: INTRAMUSCULAR | Qty: 1 | Status: AC

## 2022-06-06 ENCOUNTER — Inpatient Hospital Stay: Payer: BC Managed Care – PPO | Attending: Oncology

## 2022-06-06 VITALS — BP 130/74 | HR 82 | Temp 98.0°F | Resp 16 | Wt 137.1 lb

## 2022-06-06 DIAGNOSIS — C50919 Malignant neoplasm of unspecified site of unspecified female breast: Secondary | ICD-10-CM | POA: Insufficient documentation

## 2022-06-06 DIAGNOSIS — Z5111 Encounter for antineoplastic chemotherapy: Secondary | ICD-10-CM | POA: Diagnosis not present

## 2022-06-06 DIAGNOSIS — Z170 Estrogen receptor positive status [ER+]: Secondary | ICD-10-CM

## 2022-06-06 MED ORDER — SODIUM CHLORIDE 0.9% FLUSH
10.0000 mL | INTRAVENOUS | Status: DC | PRN
  Administered 2022-06-06: 10 mL

## 2022-06-06 MED ORDER — SODIUM CHLORIDE 0.9 % IV SOLN: Freq: Once | INTRAVENOUS | Status: AC

## 2022-06-06 MED ORDER — SODIUM CHLORIDE 0.9 % IV SOLN
10.0000 mg | Freq: Once | INTRAVENOUS | Status: AC
  Administered 2022-06-06: 10 mg via INTRAVENOUS
  Filled 2022-06-06: qty 10

## 2022-06-06 MED ORDER — HEPARIN SOD (PORK) LOCK FLUSH 100 UNIT/ML IV SOLN
500.0000 [IU] | Freq: Once | INTRAVENOUS | Status: AC | PRN
  Administered 2022-06-06: 500 [IU]

## 2022-06-06 MED ORDER — DIPHENHYDRAMINE HCL 50 MG/ML IJ SOLN
50.0000 mg | Freq: Once | INTRAMUSCULAR | Status: AC
  Administered 2022-06-06: 50 mg via INTRAVENOUS
  Filled 2022-06-06: qty 1

## 2022-06-06 MED ORDER — SODIUM CHLORIDE 0.9 % IV SOLN
175.0000 mg/m2 | Freq: Once | INTRAVENOUS | Status: AC
  Administered 2022-06-06: 294 mg via INTRAVENOUS
  Filled 2022-06-06: qty 49

## 2022-06-06 MED ORDER — FAMOTIDINE IN NACL 20-0.9 MG/50ML-% IV SOLN
20.0000 mg | Freq: Once | INTRAVENOUS | Status: AC
  Administered 2022-06-06: 20 mg via INTRAVENOUS
  Filled 2022-06-06: qty 50

## 2022-06-06 NOTE — Patient Instructions (Signed)
Alexandra Singh  Discharge Instructions: Thank you for choosing [facility] to provide your oncology and hematology care.  If you have a lab appointment with the [facility], please go directly to the [facility] and check in at the registration area.   Wear comfortable clothing and clothing appropriate for easy access to any Portacath or PICC line.   We strive to give you quality time with your provider. You may need to reschedule your appointment if you arrive late (15 or more minutes).  Arriving late affects you and other patients whose appointments are after yours.  Also, if you miss three or more appointments without notifying the office, you may be dismissed from the clinic at the provider's discretion.      For prescription refill requests, have your pharmacy contact our office and allow 72 hours for refills to be completed.    Today you received the following chemotherapy and/or immunotherapy agents TaxolPaclitaxel injection What is this medication? PACLITAXEL (PAK li TAX el) is a chemotherapy drug. It targets fast dividing cells, like cancer cells, and causes these cells to die. This medicine is used to treat ovarian cancer, breast cancer, lung cancer, Kaposi's sarcoma, and other cancers. This medicine may be used for other purposes; ask your health care provider or pharmacist if you have questions. COMMON BRAND NAME(S): Onxol, Taxol What should I tell my care team before I take this medication? They need to know if you have any of these conditions: history of irregular heartbeat liver disease low blood counts, like low white cell, platelet, or red cell counts lung or breathing disease, like asthma tingling of the fingers or toes, or other nerve disorder an unusual or allergic reaction to paclitaxel, alcohol, polyoxyethylated castor oil, other chemotherapy, other medicines, foods, dyes, or preservatives pregnant or trying to get  pregnant breast-feeding How should I use this medication? This drug is given as an infusion into a vein. It is administered in a hospital or clinic by a specially trained health care professional. Talk to your pediatrician regarding the use of this medicine in children. Special care may be needed. Overdosage: If you think you have taken too much of this medicine contact a poison control center or emergency room at once. NOTE: This medicine is only for you. Do not share this medicine with others. What if I miss a dose? It is important not to miss your dose. Call your doctor or health care professional if you are unable to keep an appointment. What may interact with this medication? Do not take this medicine with any of the following medications: live virus vaccines This medicine may also interact with the following medications: antiviral medicines for hepatitis, HIV or AIDS certain antibiotics like erythromycin and clarithromycin certain medicines for fungal infections like ketoconazole and itraconazole certain medicines for seizures like carbamazepine, phenobarbital, phenytoin gemfibrozil nefazodone rifampin St. John's wort This list may not describe all possible interactions. Give your health care provider a list of all the medicines, herbs, non-prescription drugs, or dietary supplements you use. Also tell them if you smoke, drink alcohol, or use illegal drugs. Some items may interact with your medicine. What should I watch for while using this medication? Your condition will be monitored carefully while you are receiving this medicine. You will need important blood work done while you are taking this medicine. This medicine can cause serious allergic reactions. To reduce your risk you will need to take other medicine(s) before treatment with this  medicine. If you experience allergic reactions like skin rash, itching or hives, swelling of the face, lips, or tongue, tell your doctor or health  care professional right away. In some cases, you may be given additional medicines to help with side effects. Follow all directions for their use. This drug may make you feel generally unwell. This is not uncommon, as chemotherapy can affect healthy cells as well as cancer cells. Report any side effects. Continue your course of treatment even though you feel ill unless your doctor tells you to stop. Call your doctor or health care professional for advice if you get a fever, chills or sore throat, or other symptoms of a cold or flu. Do not treat yourself. This drug decreases your body's ability to fight infections. Try to avoid being around people who are sick. This medicine may increase your risk to bruise or bleed. Call your doctor or health care professional if you notice any unusual bleeding. Be careful brushing and flossing your teeth or using a toothpick because you may get an infection or bleed more easily. If you have any dental work done, tell your dentist you are receiving this medicine. Avoid taking products that contain aspirin, acetaminophen, ibuprofen, naproxen, or ketoprofen unless instructed by your doctor. These medicines may hide a fever. Do not become pregnant while taking this medicine. Women should inform their doctor if they wish to become pregnant or think they might be pregnant. There is a potential for serious side effects to an unborn child. Talk to your health care professional or pharmacist for more information. Do not breast-feed an infant while taking this medicine. Men are advised not to father a child while receiving this medicine. This product may contain alcohol. Ask your pharmacist or healthcare provider if this medicine contains alcohol. Be sure to tell all healthcare providers you are taking this medicine. Certain medicines, like metronidazole and disulfiram, can cause an unpleasant reaction when taken with alcohol. The reaction includes flushing, headache, nausea,  vomiting, sweating, and increased thirst. The reaction can last from 30 minutes to several hours. What side effects may I notice from receiving this medication? Side effects that you should report to your doctor or health care professional as soon as possible: allergic reactions like skin rash, itching or hives, swelling of the face, lips, or tongue breathing problems changes in vision fast, irregular heartbeat high or low blood pressure mouth sores pain, tingling, numbness in the hands or feet signs of decreased platelets or bleeding - bruising, pinpoint red spots on the skin, black, tarry stools, blood in the urine signs of decreased red blood cells - unusually weak or tired, feeling faint or lightheaded, falls signs of infection - fever or chills, cough, sore throat, pain or difficulty passing urine signs and symptoms of liver injury like dark yellow or brown urine; general ill feeling or flu-like symptoms; light-colored stools; loss of appetite; nausea; right upper belly pain; unusually weak or tired; yellowing of the eyes or skin swelling of the ankles, feet, hands unusually slow heartbeat Side effects that usually do not require medical attention (report to your doctor or health care professional if they continue or are bothersome): diarrhea hair loss loss of appetite muscle or joint pain nausea, vomiting pain, redness, or irritation at site where injected tiredness This list may not describe all possible side effects. Call your doctor for medical advice about side effects. You may report side effects to FDA at 1-800-FDA-1088. Where should I keep my medication? This drug is  given in a hospital or clinic and will not be stored at home. NOTE: This sheet is a summary. It may not cover all possible information. If you have questions about this medicine, talk to your doctor, pharmacist, or health care provider.  2023 Elsevier/Gold Standard (2021-10-22 00:00:00)       To help prevent  nausea and vomiting after your treatment, we encourage you to take your nausea medication as directed.  BELOW ARE SYMPTOMS THAT SHOULD BE REPORTED IMMEDIATELY: *FEVER GREATER THAN 100.4 F (38 C) OR HIGHER *CHILLS OR SWEATING *NAUSEA AND VOMITING THAT IS NOT CONTROLLED WITH YOUR NAUSEA MEDICATION *UNUSUAL SHORTNESS OF BREATH *UNUSUAL BRUISING OR BLEEDING *URINARY PROBLEMS (pain or burning when urinating, or frequent urination) *BOWEL PROBLEMS (unusual diarrhea, constipation, pain near the anus) TENDERNESS IN MOUTH AND THROAT WITH OR WITHOUT PRESENCE OF ULCERS (sore throat, sores in mouth, or a toothache) UNUSUAL RASH, SWELLING OR PAIN  UNUSUAL VAGINAL DISCHARGE OR ITCHING   Items with * indicate a potential emergency and should be followed up as soon as possible or go to the Emergency Department if any problems should occur.  Please show the CHEMOTHERAPY ALERT CARD or IMMUNOTHERAPY ALERT CARD at check-in to the Emergency Department and triage nurse.  Should you have questions after your visit or need to cancel or reschedule your appointment, please contact [facility]  Dept: [phone]  and follow the prompts.  Office hours are 8:00 a.m. to 4:30 p.m. Monday - Friday. Please note that voicemails left after 4:00 p.m. may not be returned until the following business day.  We are closed weekends and major holidays. You have access to a nurse at all times for urgent questions. Please call the main number to the clinic Dept: [phone] and follow the prompts.  For any non-urgent questions, you may also contact your provider using MyChart. We now offer e-Visits for anyone 15 and older to request care online for non-urgent symptoms. For details visit [email or web address].   Also download the MyChart app! Go to the app store, search "MyChart", open the app, select [facility], and log in with your MyChart username and password.  Masks are optional in the cancer centers.  If you would like for your care team to wear a mask while they are taking care of you, please let them know. For doctor visits, patients may have with them one support person who is at least 64 years old. At this time, visitors are not allowed in the infusion area.

## 2022-06-08 ENCOUNTER — Encounter: Payer: Self-pay | Admitting: Oncology

## 2022-06-09 ENCOUNTER — Encounter: Payer: Self-pay | Admitting: Oncology

## 2022-06-13 ENCOUNTER — Encounter: Payer: Self-pay | Admitting: Oncology

## 2022-06-13 ENCOUNTER — Telehealth: Payer: Self-pay

## 2022-06-13 NOTE — Telephone Encounter (Signed)
Patient called wanting to know if she can have her teeth cleaned tomorrow. She finished Chemo last week. Per Dayton Scrape, FNP-BC patient can get a cleaning as long as there is no dental work to be done. Called patient and notified her of the message.

## 2022-06-21 ENCOUNTER — Encounter: Payer: Self-pay | Admitting: Hematology and Oncology

## 2022-06-23 NOTE — Progress Notes (Signed)
Alexandra Singh  [address] [place],  [place]  [address] [phone]  Clinic Day:  06/24/2022  Referring physician: Gweneth Fritter, FNP   HISTORY OF PRESENT ILLNESS:  The patient is a 66 y.o. female with stage IIIA hormone positive breast cancer. She comes in today to go over her breast MRI after recently completing all of her dose dense chemotherapy, which consisted of AC x 4, followed by paclitaxel x 4.  The patient claims to have tolerated her 4th and cycle of dose dense paclitaxel fairly well.  She has noticed slight neuropathy in her finger and toe tips.  She denies having any other significant side effects from her chemotherapy. She is very pleased with the response she has had from her dose dense chemotherapy.  PHYSICAL EXAM:  Blood pressure 130/65, pulse 77, temperature 99.1 F (37.3 C), resp. rate 14, height 5' 6.6" (1.692 m), weight 135 lb 1.6 oz (61.3 kg), SpO2 98 %. Wt Readings from Last 3 Encounters:  06/24/22 135 lb 1.6 oz (61.3 kg)  06/06/22 137 lb 1.9 oz (62.2 kg)  06/03/22 135 lb 12.8 oz (61.6 kg)   Body mass index is 21.41 kg/m.  Performance status (ECOG): 0 - Asymptomatic  Physical Exam Vitals and nursing note reviewed.  Constitutional:      General: She is not in acute distress.    Appearance: Normal appearance.  HENT:     Head: Normocephalic and atraumatic.     Mouth/Throat:     Mouth: Mucous membranes are moist.     Pharynx: Oropharynx is clear. No oropharyngeal exudate or posterior oropharyngeal erythema.  Eyes:     General: No scleral icterus.    Extraocular Movements: Extraocular movements intact.     Conjunctiva/sclera: Conjunctivae normal.     Pupils: Pupils are equal, round, and reactive to light.  Cardiovascular:     Rate and Rhythm: Normal rate and regular rhythm.     Heart sounds: Normal heart sounds. No murmur heard.    No friction rub. No gallop.  Pulmonary:     Effort: Pulmonary effort is normal.      Breath sounds: Normal breath sounds. No wheezing, rhonchi or rales.  Chest:  Breasts:    Right: Normal.     Comments: Mild firmness in the left upper outer quadrant without discrete mass. Abdominal:     General: There is no distension.     Palpations: Abdomen is soft. There is no hepatomegaly, splenomegaly or mass.     Tenderness: There is no abdominal tenderness.  Musculoskeletal:        General: Normal range of motion.     Cervical back: Normal range of motion and neck supple. No tenderness.     Right lower leg: No edema.     Left lower leg: No edema.  Lymphadenopathy:     Cervical: No cervical adenopathy.     Upper Body:     Right upper body: No supraclavicular or axillary adenopathy.     Left upper body: No supraclavicular or axillary adenopathy.     Lower Body: No right inguinal adenopathy. No left inguinal adenopathy.  Skin:    General: Skin is warm and dry.     Coloration: Skin is not jaundiced.     Findings: No rash.  Neurological:     Mental Status: She is alert and oriented to person, place, and time.     Cranial Nerves: No cranial nerve deficit.  Psychiatric:  Mood and Affect: Mood normal.        Behavior: Behavior normal.        Thought Content: Thought content normal.    STUDIES:  Her postchemotherapy breast MRI revealed the following: FINDINGS: Breast composition: c. Heterogeneous fibroglandular tissue.  Background parenchymal enhancement: Minimal  Right breast: No mass or suspicious enhancement. Biopsy clip artifact within the OUTER RIGHT breast identified..  Left breast: Near complete resolution of enhancement throughout the LEFT breast in the areas of known malignancy, with a minimal residual enhancement in these areas. No discrete masses are now identified. Biopsy clip artifact within the UPPER OUTER LEFT breast identified.  Lymph nodes: No abnormal appearing lymph nodes.  Ancillary findings: None.  IMPRESSION: 1. Treatment response with  near complete resolution of abnormal enhancement throughout the LEFT breast and no discrete residual masses identified at sites of previously identified LEFT breast malignancy. 2. No new suspicious findings or abnormal appearing lymph nodes.  RECOMMENDATION: Treatment plan  BI-RADS CATEGORY 6: Known biopsy-proven malignancy.   ASSESSMENT & PLAN:  Assessment/Plan:  A 66 y.o. female with stage IIIA hormone receptor positive breast cancer.  In clinic today, I went over all of her breast MRI images with her, for which she could see the significant disease response she has had from her neoadjuvant chemotherapy.  Understandably, she is very pleased with her results.  Her physical exams have also shown a significant improvement in her disease burden since her neoadjuvant chemotherapy commenced.  The patient is already scheduled to see general surgery in early August 2023 to discuss the type of breast cancer surgery she needs.  I will see her back in 6 weeks for repeat clinical assessment.  Hopefully, by then, her breast cancer pathology will be back from which her adjuvant treatment plan can be formulated.  The patient understands all the plans discussed today and is in agreement with them.    Franchesca Veneziano Macarthur Critchley, MD

## 2022-06-24 ENCOUNTER — Inpatient Hospital Stay: Payer: BC Managed Care – PPO | Admitting: Oncology

## 2022-06-24 ENCOUNTER — Telehealth: Payer: Self-pay | Admitting: Oncology

## 2022-06-24 VITALS — BP 130/65 | HR 77 | Temp 99.1°F | Resp 14 | Ht 66.6 in | Wt 135.1 lb

## 2022-06-24 DIAGNOSIS — C50112 Malignant neoplasm of central portion of left female breast: Secondary | ICD-10-CM

## 2022-06-24 DIAGNOSIS — Z170 Estrogen receptor positive status [ER+]: Secondary | ICD-10-CM

## 2022-06-24 NOTE — Progress Notes (Unsigned)
Patient: Alexandra Singh           Date of Birth: 1956-10-23           MRN: [id number] Visit Date: 06/24/2022 PCP: Gweneth Fritter, FNP     Cervical Exam Exam not completed.  Patient's History Patient Active Problem List   Diagnosis Date Noted   Malignant neoplasm of female breast ([facility]) 02/15/2022   GAD (generalized anxiety disorder) 01/21/2022   Essential hypertension 02/19/2020   Vitamin D deficiency 03/16/2016   Rheumatoid arthritis of multiple sites with negative rheumatoid factor ([facility]) 03/10/2016   Past Medical History:  Diagnosis Date   Arthritis    RA   Cataract    HX - surgery to remove - bilateral   Hypertension     Family History  Problem Relation Age of Onset   Colon cancer Neg Hx    Rectal cancer Neg Hx    Stomach cancer Neg Hx    Colon polyps Neg Hx     Social History   Occupational History   Not on file  Tobacco Use   Smoking status: Never   Smokeless tobacco: Never  Vaping Use   Vaping Use: Never used  Substance and Sexual Activity   Alcohol use: Not on file    Comment: bi-weekly 1-2 drinks   Drug use: Never   Sexual activity: Not on file

## 2022-06-24 NOTE — Progress Notes (Signed)
Face to face  visit with pt and husband in [place]. Pt has completed chemotherapy and is here today for a MedOnc visit to discuss results of recent breast MRI. Pt reports that she is doing very well. The patient's next step will be surgery.

## 2022-06-24 NOTE — Progress Notes (Unsigned)
Patient: Alexandra Singh           Date of Birth: 08-Nov-1956           MRN: [id number] Visit Date: 06/24/2022 PCP: Gweneth Fritter, FNP     Cervical Exam Exam not completed.  Patient's History Patient Active Problem List   Diagnosis Date Noted   Malignant neoplasm of female breast ([facility]) 02/15/2022   GAD (generalized anxiety disorder) 01/21/2022   Essential hypertension 02/19/2020   Vitamin D deficiency 03/16/2016   Rheumatoid arthritis of multiple sites with negative rheumatoid factor ([facility]) 03/10/2016   Past Medical History:  Diagnosis Date   Arthritis    RA   Cataract    HX - surgery to remove - bilateral   Hypertension     Family History  Problem Relation Age of Onset   Colon cancer Neg Hx    Rectal cancer Neg Hx    Stomach cancer Neg Hx    Colon polyps Neg Hx     Social History   Occupational History   Not on file  Tobacco Use   Smoking status: Never   Smokeless tobacco: Never  Vaping Use   Vaping Use: Never used  Substance and Sexual Activity   Alcohol use: Not on file    Comment: bi-weekly 1-2 drinks   Drug use: Never   Sexual activity: Not on file

## 2022-06-24 NOTE — Telephone Encounter (Signed)
Per 06/24/22 los next appt scheduled and confirmed with patient 

## 2022-07-20 HISTORY — PX: MASTECTOMY: SHX3

## 2022-08-04 NOTE — Progress Notes (Signed)
ON PATHWAY REGIMEN - Breast  No Change  Continue With Treatment as Ordered.  Original Decision Date/Time: 02/15/2022 16:43     Cycles 1 through 4 = every 14 days:     Paclitaxel      Pegfilgrastim-xxxx    Cycles 5 through 8 = every 14 days:     Doxorubicin      Cyclophosphamide      Pegfilgrastim-xxxx   **Always confirm dose/schedule in your pharmacy ordering system**  Patient Characteristics: Preoperative or Nonsurgical Candidate (Clinical Staging), Neoadjuvant Therapy followed by Surgery, Invasive Disease, Chemotherapy, HER2 Negative/Unknown/Equivocal, ER Positive Therapeutic Status: Preoperative or Nonsurgical Candidate (Clinical Staging) AJCC M Category: cM0 AJCC Grade: G2 Breast Surgical Plan: Neoadjuvant Therapy followed by Surgery ER Status: Positive (+) AJCC 8 Stage Grouping: IIIA HER2 Status: Negative (-) AJCC T Category: cT3 AJCC N Category: cN1 PR Status: Negative (-) Intent of Therapy: Curative Intent, Discussed with Patient

## 2022-08-04 NOTE — Progress Notes (Signed)
[facility]  [address] [place],  [place]  [address] [phone]  Clinic Day:  08/05/2022  Referring physician: Gweneth Fritter, FNP   HISTORY OF PRESENT ILLNESS:  The patient is a 66 y.o. female with stage IIIA hormone positive breast cancer.  The patient recently underwent a left mastectomy.  She comes in today to go over her surgical pathology and its implications.  Overall, the patient has been doing well.  She denies having any changes over her left chest wall and right breast which concern her for early disease recurrence.  Her left mastectomy was preceded by neoadjuvant dose dense chemotherapy, which consisted of AC x 4, followed by paclitaxel x 4.    PHYSICAL EXAM:  Blood pressure (!) 157/79, pulse 62, temperature 98.4 F (36.9 C), resp. rate 14, height 5' 6.6" (1.692 m), weight 133 lb 14.4 oz (60.7 kg), SpO2 99 %. Wt Readings from Last 3 Encounters:  08/05/22 133 lb 14.4 oz (60.7 kg)  06/24/22 135 lb 1.6 oz (61.3 kg)  06/06/22 137 lb 1.9 oz (62.2 kg)   Body mass index is 21.22 kg/m.  Performance status (ECOG): 0 - Asymptomatic  Physical Exam Vitals and nursing note reviewed.  Constitutional:      General: She is not in acute distress.    Appearance: Normal appearance.  HENT:     Head: Normocephalic and atraumatic.     Mouth/Throat:     Mouth: Mucous membranes are moist.     Pharynx: Oropharynx is clear. No oropharyngeal exudate or posterior oropharyngeal erythema.  Eyes:     General: No scleral icterus.    Extraocular Movements: Extraocular movements intact.     Conjunctiva/sclera: Conjunctivae normal.     Pupils: Pupils are equal, round, and reactive to light.  Cardiovascular:     Rate and Rhythm: Normal rate and regular rhythm.     Heart sounds: Normal heart sounds. No murmur heard.    No friction rub. No gallop.  Pulmonary:     Effort: Pulmonary effort is normal.     Breath sounds: Normal breath sounds. No wheezing,  rhonchi or rales.  Chest:  Breasts:    Right: Normal.     Left: Absent.  Abdominal:     General: There is no distension.     Palpations: Abdomen is soft. There is no hepatomegaly, splenomegaly or mass.     Tenderness: There is no abdominal tenderness.  Musculoskeletal:        General: Normal range of motion.     Cervical back: Normal range of motion and neck supple. No tenderness.     Right lower leg: No edema.     Left lower leg: No edema.  Lymphadenopathy:     Cervical: No cervical adenopathy.     Upper Body:     Right upper body: No supraclavicular or axillary adenopathy.     Left upper body: No supraclavicular or axillary adenopathy.     Lower Body: No right inguinal adenopathy. No left inguinal adenopathy.  Skin:    General: Skin is warm and dry.     Coloration: Skin is not jaundiced.     Findings: No rash.  Neurological:     Mental Status: She is alert and oriented to person, place, and time.     Cranial Nerves: No cranial nerve deficit.  Psychiatric:        Mood and Affect: Mood normal.        Behavior: Behavior normal.  Thought Content: Thought content normal.    PATHOLOGY:  Her left mastectomy pathology (after neoadjuvant chemotherapy) revealed the following:    ASSESSMENT & PLAN:  Assessment/Plan:  A 66 y.o. female with stage IIIA hormone receptor positive breast cancer, status post neoadjuvant chemotherapy in August 2023.  In clinic today, I went over her surgical pathology with her.  Although she had a very good response to treatment, she still had evidence of residual carcinoma in her left breast, spanning 4.8 cm.  Furthermore, although not macrometastases, isolated tumor cells were seen in multiple lymph nodes.  Her Ki-67 score was also elevated at 25%.  Based upon these findings, I do consider the patient a higher risk for future disease recurrence to where I will give her a combination of abemaciclib/letrozole for her adjuvant endocrine therapy.   Abemaciclib will be given at 150 mg twice daily for 2 years.  Letrozole will be given at 2.5 mg daily for 5 years.  The patient is already scheduled to see radiation oncology in the forthcoming days to plan for adjuvant chest wall/axillary radiation to prevent local disease recurrence.  Clinically, she appears to be doing well.  I will see her back in 4 months for repeat clinical assessment.  The patient understands all the plans discussed today and is in agreement with them.    Isis Costanza Macarthur Critchley, MD

## 2022-08-05 ENCOUNTER — Telehealth: Payer: Self-pay | Admitting: Pharmacy Technician

## 2022-08-05 ENCOUNTER — Telehealth: Payer: Self-pay

## 2022-08-05 ENCOUNTER — Other Ambulatory Visit: Payer: Self-pay | Admitting: Oncology

## 2022-08-05 ENCOUNTER — Inpatient Hospital Stay: Payer: BC Managed Care – PPO | Attending: Oncology | Admitting: Oncology

## 2022-08-05 ENCOUNTER — Other Ambulatory Visit (HOSPITAL_COMMUNITY): Payer: Self-pay

## 2022-08-05 VITALS — BP 157/79 | HR 62 | Temp 98.4°F | Resp 14 | Ht 66.6 in | Wt 133.9 lb

## 2022-08-05 DIAGNOSIS — C50011 Malignant neoplasm of nipple and areola, right female breast: Secondary | ICD-10-CM | POA: Diagnosis not present

## 2022-08-05 MED ORDER — ABEMACICLIB 150 MG PO TABS
150.0000 mg | ORAL_TABLET | Freq: Two times a day (BID) | ORAL | 23 refills | Status: DC
  Filled 2022-08-05: qty 70, 35d supply, fill #0

## 2022-08-05 MED ORDER — LETROZOLE 2.5 MG PO TABS: 2.5000 mg | ORAL_TABLET | Freq: Every day | ORAL | 3 refills | Status: AC

## 2022-08-05 NOTE — Telephone Encounter (Signed)
Received New start notification for  Verzenio '150mg'$ . Will update as we work through the benefits process.   Submitted a Prior Authorization request to [employer] for  Verzenio '150mg'$   via CoverMyMeds. Will update once we receive a response.   Key: [facility]

## 2022-08-05 NOTE — Telephone Encounter (Signed)
Oral Oncology Pharmacist Encounter   Received new prescription for Abemaciclib (Verzenio) for the treatment of early, high risk, hormone receptor positive, HER2 negative breast cancer in conjunction with letrozole, planned duration of 2 years.  Labs from 07/14/22 assessed, no interventions needed. Prescription dose and frequency assessed.  Current medication list in Epic reviewed, DDIs with Verzenio identified: none  Evaluated chart and no patient barriers to medication adherence noted.   Patient agreement for treatment documented in MD note on 08/05/2022.  Prescription has been e-scribed to the [facility] for benefits analysis and approval.  Oral Oncology Clinic will continue to follow for insurance authorization, copayment issues, initial counseling and start date.  Drema Halon, PharmD Hematology/Oncology Clinical Pharmacist [facility] [phone] 08/05/2022 12:22 PM

## 2022-08-09 ENCOUNTER — Other Ambulatory Visit (HOSPITAL_COMMUNITY): Payer: Self-pay

## 2022-08-09 NOTE — Telephone Encounter (Signed)
Oral Oncology Patient Advocate Encounter  Prior Authorization for Verzenio '150mg'$  has been approved.    PA# [id number] Effective dates: 08/09/22 through 08/08/23.  Awaiting PA to be updated in system to process claim.  Oral Oncology Clinic will continue to follow.

## 2022-08-10 ENCOUNTER — Other Ambulatory Visit (HOSPITAL_COMMUNITY): Payer: Self-pay

## 2022-08-10 ENCOUNTER — Other Ambulatory Visit: Payer: Self-pay

## 2022-08-10 ENCOUNTER — Inpatient Hospital Stay: Payer: BC Managed Care – PPO

## 2022-08-10 DIAGNOSIS — C50011 Malignant neoplasm of nipple and areola, right female breast: Secondary | ICD-10-CM

## 2022-08-10 MED ORDER — ABEMACICLIB 150 MG PO TABS: 150.0000 mg | ORAL_TABLET | Freq: Two times a day (BID) | ORAL | 23 refills | Status: DC

## 2022-08-10 NOTE — Progress Notes (Signed)
Face to face visit with pt and husband in [place]. Pt completed her surgery on 07/20/2022. She is here for teaching on her oral meds today and will be seen by radiation tomorrow. Pt reports that she is doing well.

## 2022-08-11 ENCOUNTER — Other Ambulatory Visit (HOSPITAL_COMMUNITY): Payer: Self-pay

## 2022-08-11 NOTE — Telephone Encounter (Signed)
Patient signed up for copay card. Patient locked into [place], prescription has been sent.

## 2022-08-11 NOTE — Progress Notes (Signed)
Oral Chemotherapy Pharmacist Encounter  I spoke with patient for overview of: Verzenio for the treatment of early, high risk, hormone-receptor positive breast cancer, in combination with letrozole, planned duration until disease progression or unacceptable toxicity or for a total of 2 years.   Counseled patient on administration, dosing, side effects, monitoring, drug-food interactions, safe handling, storage, and disposal.  Patient will take Verzenio '150mg'$  tablets, 1 tablet by mouth twice daily without regard to food.  Patient knows to avoid grapefruit and grapefruit juice.  Verzenio start date: once patient receives from [employer] [facility].  Adverse effects include but are not limited to: diarrhea, fatigue, nausea, abdominal pain, decreased blood counts, and increased liver function tests, and joint pains. Severe, life-threatening, and/or fatal interstitial lung disease (ILD) and/or pneumonitis may occur with CDK 4/6 inhibitors.  Patient has anti-emetic on hand and knows to take it if nausea develops.   Patient will obtain anti diarrheal and alert the office of 4 or more loose stools above baseline.  Reviewed with patient importance of keeping a medication schedule and plan for any missed doses. No barriers to medication adherence identified.  Medication reconciliation performed and medication/allergy list updated.  Insurance authorization for [employer] has been obtained. Patient must fill medication through [place]. Prescription has been sent including copay card information.   Patient informed the pharmacy will reach out 5-7 days prior to needing next fill of Verzenio to coordinate continued medication acquisition to prevent break in therapy.  All questions answered.  Mrs. Arps voiced understanding and appreciation.   Medication education was given to patient in person in clinic. Patient knows to call the office with questions or concerns. Oral  Chemotherapy Clinic phone number provided to patient.   Drema Halon, PharmD Hematology/Oncology Clinical Pharmacist [facility] [phone] 08/11/2022   10:35 AM

## 2022-08-12 ENCOUNTER — Other Ambulatory Visit (HOSPITAL_COMMUNITY): Payer: Self-pay

## 2022-08-15 ENCOUNTER — Telehealth: Payer: Self-pay

## 2022-08-15 NOTE — Telephone Encounter (Signed)
Oral Oncology Pharmacist Encounter  Patient is scheduled to receive verzenio medication on 08/18/2022 from [place].   Drema Halon, PharmD Hematology/Oncology Clinical Pharmacist [facility] [phone]

## 2022-08-15 NOTE — Telephone Encounter (Signed)
Received call from pharmacy tech @ [facility] this morning. They wanted to clarify that this is the initial start of treatment for pt. I confirmed that this is new diagnosis and prescription. She then asked if they could send out the starter kit for Verzenio, which includes free Imodium. She requested to have this ok' d by pharmacist. I sent message to [facility]. Kaitlyn agreed that it was a great idea to send out the starter kit.  I gave the approval to [facility] @ [facility] (9 min call)

## 2022-08-17 NOTE — Telephone Encounter (Signed)
Oral Oncology Pharmacist Encounter  Spoke to patient who states the verzenio will arrive from [place] on 08/18/2022. Informed patient to start tomorrow night with medication and then begin the twice daily on Friday. Patient stated she understood.   Drema Halon, PharmD Hematology/Oncology Clinical Pharmacist [facility] [phone]

## 2022-08-25 ENCOUNTER — Telehealth: Payer: Self-pay

## 2022-08-25 NOTE — Telephone Encounter (Signed)
I spoke with pt.to see how she is doing with the Verzenio. She replied, "I don't think I'm doing really well, but Donnald Garre been told the side effects are normal. I keep having diarrhea and I'm not sure about leaving the house". She had 2 episodes of diarrhea on Tuesday, controlled by Imodium. No diarrhea on Wednesday. This morning she had normal BM, then diarrhea episode @ 1030. She is nervous about going to radiation everyday and the possibility of having diarrhea. I told her she may want to take an Imodium once in the morning on radiation days. If she were to get constipated, she could move the Imodium to every other day. She is taking the Verzenio w/food at 8a, and 8pm. No missed doses. She denies fever, N/V, & skin rashes. She has had 1 episode of abdominal pain today. I made her an appt to see Drema Halon, [facility], next week for further f/u. She also mentions that her rheumatologist told her to not restart MTX until after radiation. I reminded pt to call us if she develops temp of 100.4 or higher, day or night. She verbalized understanding.

## 2022-08-31 ENCOUNTER — Inpatient Hospital Stay (HOSPITAL_BASED_OUTPATIENT_CLINIC_OR_DEPARTMENT_OTHER): Payer: BC Managed Care – PPO

## 2022-08-31 ENCOUNTER — Telehealth: Payer: Self-pay | Admitting: Oncology

## 2022-08-31 DIAGNOSIS — C50011 Malignant neoplasm of nipple and areola, right female breast: Secondary | ICD-10-CM

## 2022-08-31 NOTE — Telephone Encounter (Signed)
08/31/22 spoke with patient and rescheduled appt with [facility] on 10/05/22'@9am'$ 

## 2022-09-01 NOTE — Progress Notes (Signed)
[place]  [phone]  Referring oncologist: Lavera Guise, MD  HISTORY OF PRESENT ILLNESS: Alexandra Singh is a 66 year old female with stage IIIA HR positive, HER2 negative breast cancer who is accompanied by her husband for a two week follow up after starting abemaciclib (Verzenio). Patient started on radiation on 08/29/22 and reports that she stopped the verzenio on 08/27/22 after having significant diarrhea that day.    Current therapy: Verzenio x2 years (on hold) and letrozole x5 years  LABS:  Latest Reference Range & Units 05/06/22 00:00 05/20/22 00:00 06/03/22 00:00  Sodium 137 - 147  140 (E) 139 (E) 139 (E)  Potassium 3.5 - 5.1 mEq/L 3.9 (E) 4.0 (E) 3.9 (E)  Chloride 99 - 108  104 (E) 103 (E) 105 (E)  CO2 13 - 22  31 ! (E) 26 ! (E) 29 ! (E)  Glucose  130 (E) 96 (E) 106 (E)  BUN 4 - 21  10 (E) 14 (E) 9 (E)  Creatinine 0.5 - 1.1  0.6 (E) 0.6 (E) 0.6 (E)  Calcium 8.7 - 10.7  8.6 ! (E) 8.7 (E) 8.8 (E)  Alkaline Phosphatase 25 - 125  109 (E) 93 (E) 95 (E)  Albumin 3.5 - 5.0  4.0 (E) 4.0 (E) 4.0 (E)  AST 13 - 35  33 (E) 30 (E) 30 (E)  ALT 7 - 35 U/L 38 ! (E) 33 (E) 30 (E)  Bilirubin, Total  0.5 (E) 0.5 (E) 0.5 (E)    Latest Reference Range & Units 05/06/22 00:00 05/20/22 00:00 06/03/22 00:00  WBC  15.5 (E) 11.0 (E) 9.2 (E)  RBC 3.87 - 5.11  3.17 ! (E) 2.97 ! (E) 3.05 ! (E)  Hemoglobin 12.0 - 16.0  9.9 ! (E) 9.2 ! (E) 10.0 ! (E)  HCT 36 - 46  30 ! (E) 29 ! (E) 30 ! (E)  MCV 81 - 99    99 (E)  Platelets 150 - 400 K/uL 173 (E) 219 (E) 194 (E)    CURRENT MEDICATIONS: Outpatient Medications Prior to Visit  Medication Sig   abemaciclib (VERZENIO) 150 MG tablet Take 1 tablet (150 mg total) by mouth 2 (two) times daily.   ALPRAZolam (XANAX) 0.25 MG tablet Take 0.25 mg by mouth 2 (two) times daily as needed.   Cholecalciferol (VITAMIN D3) 50 MCG (2000 UT) capsule Take by mouth.   cyanocobalamin 1000 MCG tablet Take 1 tablet by  mouth daily.   dexamethasone (DECADRON) 4 MG tablet TAKE 2 TABLETS BY MOUTH ONCE DAILY STARTING DAY AFTER CHEMOTHERAPY. CONTINUE FOR 3 DAYS TOTAL   folic acid (FOLVITE) 1 MG tablet    letrozole (FEMARA) 2.5 MG tablet Take 1 tablet (2.5 mg total) by mouth daily.   methotrexate (RHEUMATREX) 2.5 MG tablet Takes on Monday weekly (Patient not taking: Reported on 04/22/2022)   metoprolol succinate (TOPROL-XL) 25 MG 24 hr tablet 12.5 mg at bedtime.   OLANZapine (ZYPREXA) 10 MG tablet Take 1 tablet (10 mg total) by mouth at bedtime.   ondansetron (ZOFRAN) 8 MG tablet    OVER THE COUNTER MEDICATION Take 1 capsule by mouth 3 (three) times a week. beta-carotene,A,-vits C,E/mins (OCUVITE ORAL)   oxyCODONE (OXY IR/ROXICODONE) 5 MG immediate release tablet SMARTSIG:5 Milligram(s) By Mouth Every 6 Hours PRN   prochlorperazine (COMPAZINE) 10 MG tablet    promethazine (PHENERGAN) 25 MG tablet Take 12.5 mg by mouth every 8 (eight) hours as needed.   rivaroxaban (XARELTO) 10  MG TABS tablet Take 1 tablet by mouth daily.   No facility-administered medications prior to visit.    ASSESSMENT & PLAN: After a long discussion with Mrs. Dols and her husband, we will hold off on the Verzenio at this point while she is getting radiation. She will do 6 weeks of radiation and during her 6th week she will meet with me to discuss restarting the verzenio. At that time, we will resume at the lowest dose of verzenio 35m twice daily and slowly dose increase based on tolerability.   Patient will follow up with me on 11/1 with labs.   KDrema Halon PharmD Hematology/Oncology Clinical Pharmacist [phone]

## 2022-09-14 ENCOUNTER — Inpatient Hospital Stay: Payer: BC Managed Care – PPO

## 2022-09-21 ENCOUNTER — Encounter (INDEPENDENT_AMBULATORY_CARE_PROVIDER_SITE_OTHER): Payer: Managed Care, Other (non HMO) | Admitting: Ophthalmology

## 2022-10-05 ENCOUNTER — Inpatient Hospital Stay: Payer: BC Managed Care – PPO

## 2022-10-05 ENCOUNTER — Inpatient Hospital Stay: Payer: BC Managed Care – PPO | Attending: Oncology

## 2022-10-05 DIAGNOSIS — C50011 Malignant neoplasm of nipple and areola, right female breast: Secondary | ICD-10-CM

## 2022-10-05 DIAGNOSIS — C50112 Malignant neoplasm of central portion of left female breast: Secondary | ICD-10-CM

## 2022-10-05 LAB — CBC AND DIFFERENTIAL
HCT: 39 (ref 36–46)
Hemoglobin: 13.4 (ref 12.0–16.0)
Neutrophils Absolute: 5.14
Platelets: 218 10*3/uL (ref 150–400)
WBC: 6.5

## 2022-10-05 LAB — BASIC METABOLIC PANEL
BUN: 12 (ref 4–21)
CO2: 30 — AB (ref 13–22)
Chloride: 104 (ref 99–108)
Creatinine: 0.7 (ref 0.5–1.1)
Glucose: 98
Potassium: 4.4 mEq/L (ref 3.5–5.1)
Sodium: 139 (ref 137–147)

## 2022-10-05 LAB — HEPATIC FUNCTION PANEL
ALT: 16 U/L (ref 7–35)
AST: 29 (ref 13–35)
Alkaline Phosphatase: 72 (ref 25–125)
Bilirubin, Total: 0.7

## 2022-10-05 LAB — COMPREHENSIVE METABOLIC PANEL
Albumin: 4.3 (ref 3.5–5.0)
Calcium: 9.5 (ref 8.7–10.7)

## 2022-10-05 LAB — CBC: RBC: 4.54 (ref 3.87–5.11)

## 2022-10-05 NOTE — Progress Notes (Signed)
Face to face visit with pt in [place]. Pt has seven more radiation treatments before  completing her radiation therapy. Pt reports that she is doing well with that and her endocrine therapy. Pt was unable to tolerate Verzinio initially so it was stopped until after radiation is complete. Pt is her e to discuss restarting the medicaiton.

## 2022-10-06 NOTE — Progress Notes (Addendum)
[place]  [phone]  Referring oncologist: Lavera Guise, MD  HISTORY OF PRESENT ILLNESS: Alexandra Singh is a 66 year old female with stage IIIA HR positive, HER2 negative breast cancer who is accompanied by her husband to discuss potential to retry verzenio at a low dose after radiation. She is continuing radiation at this time until next week Thursday (11/9) which is her final day of radiation. Patient states that she has a few radiation blisters that have formed and they are wanting to do close follow up with her after she finishes the radiation treatment.  Current therapy: Verzenio x2 years (on hold) and letrozole x5 years  LABS:  Latest Reference Range & Units 10/05/22 00:00  Sodium 137 - 147  139 (E)  Potassium 3.5 - 5.1 mEq/L 4.4 (E)  Chloride 99 - 108  104 (E)  CO2 13 - 22  30 ! (E)  Glucose  98 (E)  BUN 4 - 21  12 (E)  Creatinine 0.5 - 1.1  0.7 (E)  Calcium 8.7 - 10.7  9.5 (E)  Alkaline Phosphatase 25 - 125  72 (E)  Albumin 3.5 - 5.0  4.3 (E)  AST 13 - 35  29 (E)  ALT 7 - 35 U/L 16 (E)  Bilirubin, Total  0.7 (E)  WBC  6.5 (E)  RBC 3.87 - 5.11  4.54 (E)  Hemoglobin 12.0 - 16.0  13.4 (E)  HCT 36 - 46  39 (E)  Platelets 150 - 400 K/uL 218 (E)  NEUT#  5.14 (E)   CURRENT MEDICATIONS: Outpatient Medications Prior to Visit  Medication Sig   abemaciclib (VERZENIO) 150 MG tablet Take 1 tablet (150 mg total) by mouth 2 (two) times daily.   ALPRAZolam (XANAX) 0.25 MG tablet Take 0.25 mg by mouth 2 (two) times daily as needed.   Cholecalciferol (VITAMIN D3) 50 MCG (2000 UT) capsule Take by mouth.   cyanocobalamin 1000 MCG tablet Take 1 tablet by mouth daily.   dexamethasone (DECADRON) 4 MG tablet TAKE 2 TABLETS BY MOUTH ONCE DAILY STARTING DAY AFTER CHEMOTHERAPY. CONTINUE FOR 3 DAYS TOTAL   folic acid (FOLVITE) 1 MG tablet    letrozole (FEMARA) 2.5 MG tablet Take 1 tablet (2.5 mg total) by mouth daily.   methotrexate  (RHEUMATREX) 2.5 MG tablet Takes on Monday weekly (Patient not taking: Reported on 04/22/2022)   metoprolol succinate (TOPROL-XL) 25 MG 24 hr tablet 12.5 mg at bedtime.   OLANZapine (ZYPREXA) 10 MG tablet Take 1 tablet (10 mg total) by mouth at bedtime.   ondansetron (ZOFRAN) 8 MG tablet    OVER THE COUNTER MEDICATION Take 1 capsule by mouth 3 (three) times a week. beta-carotene,A,-vits C,E/mins (OCUVITE ORAL)   oxyCODONE (OXY IR/ROXICODONE) 5 MG immediate release tablet SMARTSIG:5 Milligram(s) By Mouth Every 6 Hours PRN   prochlorperazine (COMPAZINE) 10 MG tablet    promethazine (PHENERGAN) 25 MG tablet Take 12.5 mg by mouth every 8 (eight) hours as needed.   rivaroxaban (XARELTO) 10 MG TABS tablet Take 1 tablet by mouth daily.   No facility-administered medications prior to visit.    ASSESSMENT & PLAN: After a long discussion with Mrs. Legendre and her husband, we will hold off on the Verzenio at this point while she is getting radiation. She will do 6 weeks of radiation and during her 6th week she will meet with me to discuss restarting the verzenio. At that time, we will resume at the lowest dose of verzenio  31m twice daily and slowly dose increase based on tolerability. Since patient will not complete radiation until late next week and to allow her blisters to heal (she has a 2 week follow up with radiation) we will wait to start any new medications until after Thanksgiving.   Patient will follow up with me on 11/29 with labs and will potentially restart the verzenio on 12/4.   KDrema Halon PharmD Hematology/Oncology Clinical Pharmacist [phone]

## 2022-10-12 ENCOUNTER — Inpatient Hospital Stay: Payer: BC Managed Care – PPO

## 2022-10-17 ENCOUNTER — Telehealth: Payer: Self-pay | Admitting: Gastroenterology

## 2022-10-17 NOTE — Telephone Encounter (Signed)
Inbound call from patient stating that she was in [place] hospital last week from Thursday until Saturday due to passing blood. Patient stated that Dr. Keenan Bachelor advised her that she needed to follow up with Dr. Lyndel Safe within 1 to 2 weeks. I advised patient that he did not have anything until January and she stressed that she needed to be seen soon. Patient is requesting a call back to discuss. Please advise.

## 2022-10-17 NOTE — Telephone Encounter (Signed)
Pt stated that she was discharged for [facility] on Saturday and was recommended to follow up with Dr. Lyndel Safe in 1 to 2 weeks: Pt was scheduled to see Vicie Mutters PA on 10/24/2022 at 3:00 PM.  Pt Made aware: Address provided: Fax Sent to [facility] requesting records from pt recent  Hospital Visit. Fax sent to [phone]

## 2022-10-18 NOTE — Telephone Encounter (Signed)
Faxed received From [facility] Copies made:  Copy placed on Vicie Mutters PA Desk and Copy sent to be scanned into [facility]

## 2022-10-18 NOTE — Telephone Encounter (Signed)
[facility] records to request that fax be sent over:

## 2022-10-20 NOTE — Progress Notes (Signed)
10/24/2022 Alexandra Singh [id number] 1956-04-21  Referring provider: Gweneth Fritter, FNP Primary GI doctor: Dr. Lyndel Safe  ASSESSMENT AND PLAN:   Rectal bleeding with abnormal CT of the AB with transverse colitis Getting left breast radiation s/p chemo, started with diarrhea, Ab pain and rectal bleeding for 24 hours after feeling faint with working out in the yard all day. Most likely ischemic with history prior, possible radiation but less likely, negative GI pathogen panel and resolved after 24 hours, less likely inflammatory colitis with normal colon 06/2019.  Negative CTA for bleeding, and with pain, not diverticular Patient would prefer to not repeat colon Negative FOBT here in the office, no further episodes Will discuss with Dr. Lyndel Safe about possibly repeating colon If she has any further episodes will need repeat colonoscopy.   Screen for colon cancer 06/24/2019 colonoscopy Dr. Lyndel Safe diverticula, non bleeding internal hemorrhoids. Recall 10 years,   Left breast cancer Going to be getting on oral chemo that will cause diarrhea     Patient Care Team: Gweneth Fritter, FNP as PCP - General (Family Medicine) Laurell Roof, RN as Registered Nurse Marice Potter, MD as Consulting Physician (Oncology) Gatha Mayer, MD as Consulting Physician (Radiation Oncology)  HISTORY OF PRESENT ILLNESS: 66 y.o. female with a past medical history of rheumatoid arthritis, left breast cancer currently undergoing radiation, completed chemo July 3rd, left mastectomy August 16th,  hypertension and others listed below presents for evaluation of rectal bleeding and colitis.   06/24/2019 colonoscopy Dr. Lyndel Safe diverticula, non bleeding internal hemorrhoids. Recall 10 years,  11/09 to 11/11 admitted [facility] for rectal bleeding.   Had loose stools with significant blood.  Denies abdominal pain, nausea vomiting fever. Patient just completed first round of radiation therapy for left breast  cancer. CT showed colitis transverse colon near descending colon flexure, stool FOBT positive, GI pathogen panel negative. patient was discharged on prednisone 10 mg daily for 10 days, given IV Solu-Medrol in the hospital. Hemoglobin 12.4, MCV 87, platelets 263, BUN 20, creatinine 0.70 had 1 g drop of hemoglobin while in the hospital  10/13/2022 chest abdomen pelvis CTA wall thickening pericolonic edema distal transverse colon, splenic flexure and proximal to mid descending colon.  Compatible with infectious or inflammatory colitis, watershed distribution: Ischemic colitis can be seen in this region after periods of hypotension.  No evidence of active phase GI hemorrhage, cholelithiasis   Husband [name] is with her and tells some of the history.  Last 4 sessions of radiation per husband was aiming lower than prior, last session was 11/08.  She worked in the yard doing leaves and felt over heated, weak, and within the next hour she had the diarrhea start.  Had diarrhea on 11/08 in the evening 1 hour after working out in the yard, then began to have gross hematochezia 11/09 in the morning, lasted 24 hours.  States every hour would have small to medium volume BRB to dark blood, no stool. No rectal pain.  Was very low AB cramping.  No fever, chills.  No nausea  or vomiting.  Has had weight loss per patient.  She is finishing prednisone at this time, tomorrow will be the last day.  Since discharge she has not had any further episodes of diarrhea or blood in the stool. No AB pain.  Has BM every other day. Did trial of 150 mg twice a day oral chemo with imodium for 10 days middle of September, had significant diarrhea with it,  may start again but only 50 mg BID.   She  reports that she has never smoked. She has never used smokeless tobacco. She reports current alcohol use. She reports that she does not use drugs.  Current Medications:    Current Outpatient Medications (Cardiovascular):    metoprolol  succinate (TOPROL-XL) 25 MG 24 hr tablet, 12.5 mg at bedtime.    Current Outpatient Medications (Hematological):    cyanocobalamin 1000 MCG tablet, Take 1 tablet by mouth daily.   folic acid (FOLVITE) 1 MG tablet,   Current Outpatient Medications (Other):    ALPRAZolam (XANAX) 0.25 MG tablet, Take 0.25 mg by mouth 2 (two) times daily as needed.   Cholecalciferol (VITAMIN D3) 50 MCG (2000 UT) capsule, Take by mouth.   letrozole (FEMARA) 2.5 MG tablet, Take 1 tablet (2.5 mg total) by mouth daily.   methotrexate (RHEUMATREX) 2.5 MG tablet, Takes on Monday weekly   OVER THE COUNTER MEDICATION, Take 1 capsule by mouth 3 (three) times a week. beta-carotene,A,-vits C,E/mins (OCUVITE ORAL)   abemaciclib (VERZENIO) 150 MG tablet, Take 1 tablet (150 mg total) by mouth 2 (two) times daily. (Patient not taking: Reported on 10/24/2022)  Medical History:  Past Medical History:  Diagnosis Date   Arthritis    RA   Breast cancer ([place])    Cataract    HX - surgery to remove - bilateral   Diverticulosis    Hypertension    Internal hemorrhoids    Allergies:  Allergies  Allergen Reactions   Emend [Fosaprepitant Dimeglumine] Other (See Comments)    Flushing; see note from 03/01/2022 at 0937    Sulfamethoxazole Itching and Rash     Surgical History:  She  has a past surgical history that includes Tonsillectomy; Lipoma excision; Laparoscopic assisted vaginal hysterectomy; Eye surgery; Eye surgery; Colonoscopy; Wisdom tooth extraction; Breast lumpectomy (N/A); and Mastectomy (Left, 07/20/2022). Family History:  Her family history is not on file.  REVIEW OF SYSTEMS  : All other systems reviewed and negative except where noted in the History of Present Illness.  PHYSICAL EXAM: BP 126/72   Pulse 86   Ht '5\' 7"'$  (1.702 m)   Wt 130 lb 6 oz (59.1 kg)   BMI 20.42 kg/m  General:   Pleasant, well developed female in no acute distress Head:   Normocephalic and atraumatic. Eyes:  sclerae  anicteric,conjunctive pink  Heart:   regular rate and rhythm Pulm:  Clear anteriorly; no wheezing Abdomen:   Soft, Flat AB, Active bowel sounds. No tenderness . Without guarding and Without rebound, No organomegaly appreciated. Rectal: Normal external rectal exam, normal rectal tone, appreciated internal hemorrhoids, non-tender, no masses, , brown stool, hemoccult Negative Extremities:  Without edema. Msk: Symmetrical without gross deformities. Peripheral pulses intact.  Neurologic:  Alert and  oriented x4;  No focal deficits.  Skin:   Dry and intact without significant lesions or rashes. Psychiatric:  Cooperative. Normal mood and affect.    Vladimir Crofts, PA-C 3:49 PM

## 2022-10-21 ENCOUNTER — Encounter: Payer: Self-pay | Admitting: *Deleted

## 2022-10-24 ENCOUNTER — Other Ambulatory Visit (INDEPENDENT_AMBULATORY_CARE_PROVIDER_SITE_OTHER): Payer: BC Managed Care – PPO

## 2022-10-24 ENCOUNTER — Ambulatory Visit (INDEPENDENT_AMBULATORY_CARE_PROVIDER_SITE_OTHER): Payer: BC Managed Care – PPO | Admitting: Physician Assistant

## 2022-10-24 ENCOUNTER — Encounter: Payer: Self-pay | Admitting: Physician Assistant

## 2022-10-24 VITALS — BP 126/72 | HR 86 | Ht 67.0 in | Wt 130.4 lb

## 2022-10-24 DIAGNOSIS — K625 Hemorrhage of anus and rectum: Secondary | ICD-10-CM

## 2022-10-24 DIAGNOSIS — Z1211 Encounter for screening for malignant neoplasm of colon: Secondary | ICD-10-CM | POA: Diagnosis not present

## 2022-10-24 DIAGNOSIS — R935 Abnormal findings on diagnostic imaging of other abdominal regions, including retroperitoneum: Secondary | ICD-10-CM | POA: Diagnosis not present

## 2022-10-24 DIAGNOSIS — C50011 Malignant neoplasm of nipple and areola, right female breast: Secondary | ICD-10-CM

## 2022-10-24 LAB — SEDIMENTATION RATE: Sed Rate: 10 mm/hr (ref 0–30)

## 2022-10-24 LAB — CBC WITH DIFFERENTIAL/PLATELET
Basophils Absolute: 0 10*3/uL (ref 0.0–0.1)
Basophils Relative: 0.5 % (ref 0.0–3.0)
Eosinophils Absolute: 0 10*3/uL (ref 0.0–0.7)
Eosinophils Relative: 0 % (ref 0.0–5.0)
HCT: 39.4 % (ref 36.0–46.0)
Hemoglobin: 13.3 g/dL (ref 12.0–15.0)
Lymphocytes Relative: 2.3 % — ABNORMAL LOW (ref 12.0–46.0)
Lymphs Abs: 0.2 10*3/uL — ABNORMAL LOW (ref 0.7–4.0)
MCHC: 33.8 g/dL (ref 30.0–36.0)
MCV: 88.2 fl (ref 78.0–100.0)
Monocytes Absolute: 0.2 10*3/uL (ref 0.1–1.0)
Monocytes Relative: 2.4 % — ABNORMAL LOW (ref 3.0–12.0)
Neutro Abs: 8.7 10*3/uL — ABNORMAL HIGH (ref 1.4–7.7)
Neutrophils Relative %: 94.8 % — ABNORMAL HIGH (ref 43.0–77.0)
Platelets: 291 10*3/uL (ref 150.0–400.0)
RBC: 4.47 Mil/uL (ref 3.87–5.11)
RDW: 16.4 % — ABNORMAL HIGH (ref 11.5–15.5)
WBC: 9.2 10*3/uL (ref 4.0–10.5)

## 2022-10-24 NOTE — Patient Instructions (Addendum)
Please follow up with Dr Lyndel Safe in 3 months.  Your provider has requested that you go to the basement level for lab work before leaving today. Press "B" on the elevator. The lab is located at the first door on the left as you exit the elevator.  Possible ischemic colitis Stay hydrated Avoid constipation Please increase fiber or add benefiber, increase water and increase acitivity.    About Hemorrhoids  Hemorrhoids are swollen veins in the lower rectum and anus.  Also called piles, hemorrhoids are a common problem.  Hemorrhoids may be internal (inside the rectum) or external (around the anus).  Internal Hemorrhoids  Internal hemorrhoids are often painless, but they rarely cause bleeding.  The internal veins may stretch and fall down (prolapse) through the anus to the outside of the body.  The veins may then become irritated and painful.  External Hemorrhoids  External hemorrhoids can be easily seen or felt around the anal opening.  They are under the skin around the anus.  When the swollen veins are scratched or broken by straining, rubbing or wiping they sometimes bleed.  How Hemorrhoids Occur  Veins in the rectum and around the anus tend to swell under pressure.  Hemorrhoids can result from increased pressure in the veins of your anus or rectum.  Some sources of pressure are:  Straining to have a bowel movement because of constipation Waiting too long to have a bowel movement Coughing and sneezing often Sitting for extended periods of time, including on the toilet Diarrhea Obesity Trauma or injury to the anus Some liver diseases Stress Family history of hemorrhoids Pregnancy  Pregnant women should try to avoid becoming constipated, because they are more likely to have hemorrhoids during pregnancy.  In the last trimester of pregnancy, the enlarged uterus may press on blood vessels and causes hemorrhoids.  In addition, the strain of childbirth sometimes causes hemorrhoids after the  birth.  Symptoms of Hemorrhoids  Some symptoms of hemorrhoids include: Swelling and/or a tender lump around the anus Itching, mild burning and bleeding around the anus Painful bowel movements with or without constipation Bright red blood covering the stool, on toilet paper or in the toilet bowel.   Symptoms usually go away within a few days.  Always talk to your doctor about any bleeding to make sure it is not from some other causes.  Diagnosing and Treating Hemorrhoids  Diagnosis is made by an examination by your healthcare provider.  Special test can be performed by your doctor.    Most cases of hemorrhoids can be treated with: High-fiber diet: Eat more high-fiber foods, which help prevent constipation.  Ask for more detailed fiber information on types and sources of fiber from your healthcare provider. Fluids: Drink plenty of water.  This helps soften bowel movements so they are easier to pass. Sitz baths and cold packs: Sitting in lukewarm water two or three times a day for 15 minutes cleases the anal area and may relieve discomfort.  If the water is too hot, swelling around the anus will get worse.  Placing a cloth-covered ice pack on the anus for ten minutes four times a day can also help reduce selling.  Gently pushing a prolapsed hemorrhoid back inside after the bath or ice pack can be helpful. Medications: For mild discomfort, your healthcare provider may suggest over-the-counter pain medication or prescribe a cream or ointment for topical use.  The cream may contain witch hazel, zinc oxide or petroleum jelly.  Medicated suppositories are also  a treatment option.  Always consult your doctor before applying medications or creams. Procedures and surgeries: There are also a number of procedures and surgeries to shrink or remove hemorrhoids in more serious cases.  Talk to your physician about these options.  You can often prevent hemorrhoids or keep them from becoming worse by maintaining  a healthy lifestyle.  Eat a fiber-rich diet of fruits, vegetables and whole grains.  Also, drink plenty of water and exercise regularly.   2007, [employer] Doc.30

## 2022-10-25 LAB — COMPREHENSIVE METABOLIC PANEL
ALT: 16 U/L (ref 0–35)
AST: 16 U/L (ref 0–37)
Albumin: 4.6 g/dL (ref 3.5–5.2)
Alkaline Phosphatase: 61 U/L (ref 39–117)
BUN: 17 mg/dL (ref 6–23)
CO2: 27 mEq/L (ref 19–32)
Calcium: 9.6 mg/dL (ref 8.4–10.5)
Chloride: 103 mEq/L (ref 96–112)
Creatinine, Ser: 0.81 mg/dL (ref 0.40–1.20)
GFR: 76.55 mL/min (ref >60.00)
Glucose, Bld: 106 mg/dL — ABNORMAL HIGH (ref 70–99)
Potassium: 4.3 mEq/L (ref 3.5–5.1)
Sodium: 140 mEq/L (ref 135–145)
Total Bilirubin: 0.4 mg/dL (ref 0.2–1.2)
Total Protein: 6.8 g/dL (ref 6.0–8.3)

## 2022-10-25 LAB — IBC + FERRITIN
Ferritin: 124.5 ng/mL (ref 10.0–291.0)
Iron: 72 ug/dL (ref 42–145)
Saturation Ratios: 22.2 % (ref 20.0–50.0)
TIBC: 324.8 ug/dL (ref 250.0–450.0)
Transferrin: 232 mg/dL (ref 212.0–360.0)

## 2022-10-25 LAB — C-REACTIVE PROTEIN: CRP: <1 mg/dL (ref 0.5–20.0)

## 2022-10-26 LAB — TISSUE TRANSGLUTAMINASE, IGA: (tTG) Ab, IgA: <1 U/mL

## 2022-10-26 LAB — IGA: Immunoglobulin A: 122 mg/dL (ref 70–320)

## 2022-11-02 ENCOUNTER — Inpatient Hospital Stay (HOSPITAL_BASED_OUTPATIENT_CLINIC_OR_DEPARTMENT_OTHER): Payer: BC Managed Care – PPO

## 2022-11-02 DIAGNOSIS — C50011 Malignant neoplasm of nipple and areola, right female breast: Secondary | ICD-10-CM

## 2022-11-03 ENCOUNTER — Other Ambulatory Visit: Payer: Self-pay | Admitting: Oncology

## 2022-11-03 DIAGNOSIS — C50011 Malignant neoplasm of nipple and areola, right female breast: Secondary | ICD-10-CM

## 2022-11-03 MED ORDER — ABEMACICLIB 50 MG PO TABS: 50.0000 mg | ORAL_TABLET | Freq: Two times a day (BID) | ORAL | 3 refills | Status: DC

## 2022-11-03 NOTE — Progress Notes (Signed)
  [facility]  [phone]  Referring oncologist: Lavera Guise, MD  HISTORY OF PRESENT ILLNESS: Alexandra Singh is a 66 year old female with stage IIIA HR positive, HER2 negative breast cancer who is accompanied by her husband to discuss potential to retry verzenio at a low dose as she has now fully finished her radiation treatment.   Current therapy: Verzenio x2 years (tentative start date of 12/4) and letrozole x5 years  LABS:  Latest Reference Range & Units 10/05/22 00:00  Sodium 137 - 147  139 (E)  Potassium 3.5 - 5.1 mEq/L 4.4 (E)  Chloride 99 - 108  104 (E)  CO2 13 - 22  30 ! (E)  Glucose  98 (E)  BUN 4 - 21  12 (E)  Creatinine 0.5 - 1.1  0.7 (E)  Calcium 8.7 - 10.7  9.5 (E)  Alkaline Phosphatase 25 - 125  72 (E)  Albumin 3.5 - 5.0  4.3 (E)  AST 13 - 35  29 (E)  ALT 7 - 35 U/L 16 (E)  Bilirubin, Total  0.7 (E)  WBC  6.5 (E)  RBC 3.87 - 5.11  4.54 (E)  Hemoglobin 12.0 - 16.0  13.4 (E)  HCT 36 - 46  39 (E)  Platelets 150 - 400 K/uL 218 (E)  NEUT#  5.14 (E)   CURRENT MEDICATIONS: Outpatient Medications Prior to Visit  Medication Sig   ALPRAZolam (XANAX) 0.25 MG tablet Take 0.25 mg by mouth 2 (two) times daily as needed.   Cholecalciferol (VITAMIN D3) 50 MCG (2000 UT) capsule Take by mouth.   cyanocobalamin 1000 MCG tablet Take 1 tablet by mouth daily.   folic acid (FOLVITE) 1 MG tablet    letrozole (FEMARA) 2.5 MG tablet Take 1 tablet (2.5 mg total) by mouth daily.   methotrexate (RHEUMATREX) 2.5 MG tablet Takes on Monday weekly   metoprolol succinate (TOPROL-XL) 25 MG 24 hr tablet 12.5 mg at bedtime.   OVER THE COUNTER MEDICATION Take 1 capsule by mouth 3 (three) times a week. beta-carotene,A,-vits C,E/mins (OCUVITE ORAL)   No facility-administered medications prior to visit.    ASSESSMENT & PLAN: After a long discussion with Mrs. Foody and her husband, she is willing to retry the verzenio at the lowest dose of 38m  twice daily. Dr[facility] [place] [facility] [place] [facility] [place] [facility] start date of 11[facility]22. Once patient starts we will see how she is doing with twice weekly phone calls and to discuss any side effects she is having. Patient is in agreement with the plan. We discussed Imodium and to call [place] if she is still having significant diarrhea despite the use of Imodium as we do have stronger options to stop the diarrhea.   Discussed with patient that if [facility] is delayed with getting the medication to her that we would discuss a new start date depending on when she receives the medication.   Patients follow up with me will be over the phone at this time and then we will move to in person follow ups once patient is doing well on the medication.   KDrema Halon PharmD Hematology/Oncology Clinical Pharmacist [phone]

## 2022-11-09 NOTE — Progress Notes (Signed)
Since she is feeling better and had recent negative colonoscopy, would hold off on repeating colonoscopy. She has follow-up appointment 02/03/2023.  Will discuss further. If any problems or changes until follow-up, we will modify our plan.  Agree with assessment/plan.  Carmell Austria, MD [facility] [phone]

## 2022-11-17 ENCOUNTER — Telehealth: Payer: Self-pay

## 2022-11-17 NOTE — Telephone Encounter (Signed)
Oral Oncology Pharmacist Encounter  Spoke to patient on 11/16/2022 to see how she is handling restarting the verzenio medication on 11/14/2022. She states she is doing well and so far has not had any diarrhea.   Will follow up with the patient on 11/18/22.   Drema Halon, PharmD Hematology/Oncology Clinical Pharmacist [facility] [phone]

## 2022-11-21 ENCOUNTER — Telehealth: Payer: Self-pay

## 2022-11-21 NOTE — Telephone Encounter (Signed)
I spoke with pt. She is doing great. "Decreasing the dose was definitely the ticket". NO diarrhea. Pt takes the Verzenio @ 8a, and 8p after eating. No missed doses. She denies N/V, SOB, chest pain, cough, URI s/s, skin rashes, chills, and fever. I reminded pt to call us if she develops temp of 100.4 or higher, day or night. She verbalized understanding. We confirmed her next appt.

## 2022-12-05 NOTE — Progress Notes (Signed)
[facility]  [address] [place],  [place]  [address] [phone]  Clinic Day:  12/06/2022  Referring physician: Gweneth Fritter, FNP   HISTORY OF PRESENT ILLNESS:  The patient is a 67 y.o. female with stage IIIA hormone positive breast cancer.  She underwent neoadjuvant dose dense chemotherapy, which consisted of AC x 4, followed by paclitaxel x 4, followed by a left mastectomy .  Due to having fairly significant residual disease seen with her surgical pathology, she is taking abemaciclib/letrozole for her adjuvant endocrine therapy.  She is currently on abemaciclib 50 mg BID due to higher doses causing diarrhea.  Although she is doing better at her current dose, she has begun to have more abdominal pain/discomfort over the past week.  Her symptoms are getting to the point where she is seriously considering discontinuing her abemaciclib altogether.  She claims to tolerate her letrozole therapy fairly well.  As it pertains to her breast cancer, she denies having any changes which concern her for early disease recurrence.  PHYSICAL EXAM:  Blood pressure 133/67, pulse 70, temperature 98.8 F (37.1 C), resp. rate 14, height '5\' 7"'$  (1.702 m), weight 132 lb 8 oz (60.1 kg), SpO2 99 %. Wt Readings from Last 3 Encounters:  12/06/22 132 lb 8 oz (60.1 kg)  10/24/22 130 lb 6 oz (59.1 kg)  08/05/22 133 lb 14.4 oz (60.7 kg)   Body mass index is 20.75 kg/m.  Performance status (ECOG): 0 - Asymptomatic  Physical Exam Vitals and nursing note reviewed.  Constitutional:      General: She is not in acute distress.    Appearance: Normal appearance.  HENT:     Head: Normocephalic and atraumatic.     Mouth/Throat:     Mouth: Mucous membranes are moist.     Pharynx: Oropharynx is clear. No oropharyngeal exudate or posterior oropharyngeal erythema.  Eyes:     General: No scleral icterus.    Extraocular Movements: Extraocular movements intact.     Conjunctiva/sclera:  Conjunctivae normal.     Pupils: Pupils are equal, round, and reactive to light.  Cardiovascular:     Rate and Rhythm: Normal rate and regular rhythm.     Heart sounds: Normal heart sounds. No murmur heard.    No friction rub. No gallop.  Pulmonary:     Effort: Pulmonary effort is normal.     Breath sounds: Normal breath sounds. No wheezing, rhonchi or rales.  Chest:  Breasts:    Right: Normal.     Left: Absent.  Abdominal:     General: There is no distension.     Palpations: Abdomen is soft. There is no hepatomegaly, splenomegaly or mass.     Tenderness: There is no abdominal tenderness.  Musculoskeletal:        General: Normal range of motion.     Cervical back: Normal range of motion and neck supple. No tenderness.     Right lower leg: No edema.     Left lower leg: No edema.  Lymphadenopathy:     Cervical: No cervical adenopathy.     Upper Body:     Right upper body: No supraclavicular or axillary adenopathy.     Left upper body: No supraclavicular or axillary adenopathy.     Lower Body: No right inguinal adenopathy. No left inguinal adenopathy.  Skin:    General: Skin is warm and dry.     Coloration: Skin is not jaundiced.     Findings: No  rash.  Neurological:     Mental Status: She is alert and oriented to person, place, and time.     Cranial Nerves: No cranial nerve deficit.  Psychiatric:        Mood and Affect: Mood normal.        Behavior: Behavior normal.        Thought Content: Thought content normal.    PATHOLOGY:  Her left mastectomy pathology (after neoadjuvant chemotherapy) revealed the following:    ASSESSMENT & PLAN:  Assessment/Plan:  A 67 y.o. female with stage IIIA hormone receptor positive breast cancer, status post neoadjuvant chemotherapy, followed by a left mastectomy in August 2023.  As mentioned previously, the patient is contemplating stopping her abemaciclib altogether due to the abdominal discomfort that is still appears to be causing.  She  denies having any further diarrhea.  I encouraged the patient to use over-the-counter medication, such as Mylanta or Pepto, to see if that may ease her abdominal discomfort.  She also knows that if she wishes to discontinue her abemaciclib, she needs to inform our pharmacist so someone in our office can know at all times what she is taking in the adjuvant setting.  Otherwise, this patient clinically appears to be doing very well.  I will see her back in 4 months for her next clinical breast exam.  Her annual mammogram will be scheduled before her next visit for her continued radiographic breast cancer surveillance.  I will also ensure a bone density study is done around the same time to ensure there has been no bone loss while on her endocrine therapy.  The patient understands all the plans discussed today and is in agreement with them.    Feliciana Narayan Macarthur Critchley, MD

## 2022-12-06 ENCOUNTER — Other Ambulatory Visit: Payer: Self-pay | Admitting: Oncology

## 2022-12-06 ENCOUNTER — Telehealth: Payer: Self-pay | Admitting: Oncology

## 2022-12-06 ENCOUNTER — Inpatient Hospital Stay: Payer: BC Managed Care – PPO | Attending: Oncology | Admitting: Oncology

## 2022-12-06 VITALS — BP 133/67 | HR 70 | Temp 98.8°F | Resp 14 | Ht 67.0 in | Wt 132.5 lb

## 2022-12-06 DIAGNOSIS — C50112 Malignant neoplasm of central portion of left female breast: Secondary | ICD-10-CM

## 2022-12-06 DIAGNOSIS — Z170 Estrogen receptor positive status [ER+]: Secondary | ICD-10-CM | POA: Diagnosis not present

## 2022-12-06 NOTE — Telephone Encounter (Signed)
12/06/22  49mh f/u appt scheduled and confirmed with patient

## 2022-12-06 NOTE — Progress Notes (Signed)
Face to face contact with pt in [place]. Pt reports that she is doing well at present. She has completed radiation and is still on AI's and endocrine therapy. Pt reports that the time is approaching for next mammogram. We discussed that it is perfectly normal to be anxious about since the last experience resulted in life altering news. Encouraged pt to contact navigator with questions or concerns.

## 2022-12-09 ENCOUNTER — Telehealth: Payer: Self-pay

## 2022-12-13 ENCOUNTER — Encounter (INDEPENDENT_AMBULATORY_CARE_PROVIDER_SITE_OTHER): Payer: BC Managed Care – PPO | Admitting: Ophthalmology

## 2022-12-13 DIAGNOSIS — H43813 Vitreous degeneration, bilateral: Secondary | ICD-10-CM | POA: Diagnosis not present

## 2022-12-13 DIAGNOSIS — H35372 Puckering of macula, left eye: Secondary | ICD-10-CM | POA: Diagnosis not present

## 2022-12-13 DIAGNOSIS — M069 Rheumatoid arthritis, unspecified: Secondary | ICD-10-CM

## 2022-12-13 DIAGNOSIS — H338 Other retinal detachments: Secondary | ICD-10-CM | POA: Diagnosis not present

## 2022-12-13 DIAGNOSIS — Z79899 Other long term (current) drug therapy: Secondary | ICD-10-CM | POA: Diagnosis not present

## 2022-12-14 NOTE — Telephone Encounter (Signed)
Oral Chemotherapy Pharmacist Encounter  Patient called to inform me that she is overall doing well on the verzenio '50mg'$  twice daily. She has had some good days and some bad days but overall more good days. She is not having any diarrhea still but instead randomly some abdominal pain. She states that she tried the mylanta for gas which helped slightly but nothing significant. She is continuing to take the medication. Patient informed me that she is switching to medicare in Feb and I discussed with her how we would go about making sure she could continue on the medication. Patient was appreciative.   I will follow up with the patient on 12/28/22.  Drema Halon, PharmD Hematology/Oncology Clinical Pharmacist [facility] [phone]

## 2022-12-21 ENCOUNTER — Telehealth: Payer: Self-pay

## 2022-12-21 NOTE — Telephone Encounter (Signed)
I spoke with pt.She is doing well. She continues to take the [facility] @ 8a, and 8p w/food. She denies missed doses.. Pt admits to occasional abdominal pain, "nothing like before". She denies N/V, diarrhea, SOB, cough, chest pain, URI s/s, skin rash, chills and fever. We confirmed her next 2 appt's. I reminded pt to call us if she were to develop temp of 100.4 or higher, day or night. She verbalized understanding.

## 2022-12-28 ENCOUNTER — Inpatient Hospital Stay (HOSPITAL_BASED_OUTPATIENT_CLINIC_OR_DEPARTMENT_OTHER): Payer: BC Managed Care – PPO

## 2022-12-28 DIAGNOSIS — Z170 Estrogen receptor positive status [ER+]: Secondary | ICD-10-CM

## 2022-12-28 DIAGNOSIS — C50112 Malignant neoplasm of central portion of left female breast: Secondary | ICD-10-CM

## 2022-12-29 NOTE — Progress Notes (Signed)
Oral Oncology Pharmacist Encounter  Called patient to see how she is doing on the verzenio medication. She states that she has very random days where she has an upset stomach/ abdominal pain and not complete diarrhea but a loose stool. So far unable to figure out the coorelation of when these days are occurring. Patient is going to keep a diary for the next few weeks to see what foods/ time of day it occurs/ and how often it occurs so we can try to figure out the cause.  Patient will be moving to medicare on 01/05/23 and we will likely need to do patient assistance once that occurs. She will be getting a 2 week supply at the beginning of Feb which will hold her over for a few weeks while we get everything sorted with her new insurance.   Patient understands plan and appreciates the phone call.  Drema Halon, PharmD Hematology/Oncology Clinical Pharmacist [facility] [phone]

## 2023-01-05 ENCOUNTER — Other Ambulatory Visit (HOSPITAL_COMMUNITY): Payer: Self-pay

## 2023-01-05 ENCOUNTER — Telehealth: Payer: Self-pay | Admitting: Pharmacy Technician

## 2023-01-05 NOTE — Telephone Encounter (Signed)
Oral Oncology Patient Advocate Encounter  After completing a benefits investigation, prior authorization for Verzenio is not required at this time through Medicare D.  Patient's copay is $3,333.02.    Patient still has active coverage with commercial insurance at this time and may continue to use this to fill until it terminates.  Lady Deutscher, CPhT-Adv Oncology Pharmacy Patient [place] Direct Number: [phone]  Fax: [phone]

## 2023-01-06 ENCOUNTER — Telehealth: Payer: Self-pay | Admitting: Pharmacy Technician

## 2023-01-06 ENCOUNTER — Other Ambulatory Visit (HOSPITAL_COMMUNITY): Payer: Self-pay

## 2023-01-06 NOTE — Telephone Encounter (Signed)
Oral Oncology Patient Advocate Encounter   Began application for assistance for Verzenio through [employer].   Application will be submitted upon completion of necessary supporting documentation.   [employer] number [phone].   I will continue to check the status until final determination.   Lady Deutscher, CPhT-Adv Oncology Pharmacy Patient [place] Direct Number: [phone]  Fax: [phone]

## 2023-01-06 NOTE — Telephone Encounter (Signed)
Oral Oncology Patient Advocate Encounter   Submitted application for assistance for Verzenio to [employer].   Application submitted via e-fax to [place] phone number [phone].   I will continue to check the status until final determination.   Lady Deutscher, CPhT-Adv Oncology Pharmacy Patient [place] Direct Number: [phone]  Fax: [phone]

## 2023-01-09 NOTE — Telephone Encounter (Signed)
Oral Oncology Patient Advocate Encounter   Received notification that the application for assistance for Verzenio through [employer] has been approved.   [employer] number [phone].   Effective dates: 01/08/23 through 12/05/23  I have spoken to the patient.  Alexandra Singh, CPhT-Adv Oncology Pharmacy Patient [place] Direct Number: [phone]  Fax: [phone]

## 2023-02-03 ENCOUNTER — Encounter: Payer: Self-pay | Admitting: Gastroenterology

## 2023-02-03 ENCOUNTER — Ambulatory Visit (INDEPENDENT_AMBULATORY_CARE_PROVIDER_SITE_OTHER): Payer: Medicare Other | Admitting: Gastroenterology

## 2023-02-03 VITALS — Ht 67.0 in | Wt 133.5 lb

## 2023-02-03 DIAGNOSIS — K625 Hemorrhage of anus and rectum: Secondary | ICD-10-CM | POA: Diagnosis not present

## 2023-02-03 NOTE — Progress Notes (Signed)
Chief Complaint:   Referring Provider:  Gweneth Fritter, FNP      ASSESSMENT AND PLAN;   #1. Rectal Bleed (resolved). Neg colon 06/2019. No need to repeat unless further problems  #2.  Diarrhea d/t abemaciclib (almost resolved since dose has been reduced)  Plan: -Since she feels much better, she wants to hold off on colonoscopy.  I do agree. -Continue current treatment -Call if she has any problems. -Discussed in detail with the patient and patient's husband.   HPI:    Alexandra Singh is a 67 y.o. female   For follow-up visit Details please see Amanda's note dated 10/24/2022 Accompanied by her husband  No further rectal bleeding Diarrhea is only occasional ever since dose of abemaciclib has been reduced to 50BID.  No nausea, vomiting, heartburn, regurgitation, odynophagia or dysphagia.  No significant constipation.  No melena or hematochezia. No unintentional weight loss. No abdominal pain.  Most recent CBC showed mildly decreased WBC count with normal hemoglobin.  Normal LFTs.  From previous notes: -Adm to [facility] with diarrhea with rectal bleeding in November 2023.  CT showed transverse, splenic flexure and descending colitis. CTA with patent vasculature.  GI pathogens negative.  Thought to be due to abemaciclib. treated with pred with good results.   Previous GI workup:  Colonoscopy 06/24/2019 - Pancolonic diverticulosis predominantly in the sigmoid colon.  - Non-bleeding internal hemorrhoids.  - Otherwise normal to TI. - Rpt 10 yrs.  Earlier if any problems.   Past Medical History:  Diagnosis Date   Arthritis    RA   Breast cancer ([place])    Cataract    HX - surgery to remove - bilateral   Diverticulosis    Hypertension    Internal hemorrhoids     Past Surgical History:  Procedure Laterality Date   BREAST LUMPECTOMY N/A    COLONOSCOPY     10 yrs ago - normal   EYE SURGERY     right - detached retina repair   EYE SURGERY     bilateral - cataracts removed    LAPAROSCOPIC ASSISTED VAGINAL HYSTERECTOMY     LIPOMA EXCISION     right breast   MASTECTOMY Left 07/20/2022   [facility]   TONSILLECTOMY     WISDOM TOOTH EXTRACTION      Family History  Problem Relation Age of Onset   Colon cancer Neg Hx    Rectal cancer Neg Hx    Stomach cancer Neg Hx    Colon polyps Neg Hx     Social History   Tobacco Use   Smoking status: Never   Smokeless tobacco: Never  Vaping Use   Vaping Use: Never used  Substance Use Topics   Alcohol use: Yes    Comment: bi-weekly 1-2 drinks   Drug use: Never    Current Outpatient Medications  Medication Sig Dispense Refill   abemaciclib (VERZENIO) 50 MG tablet Take 1 tablet (50 mg total) by mouth 2 (two) times daily. 56 tablet 3   ALPRAZolam (XANAX) 0.25 MG tablet Take 0.25 mg by mouth 2 (two) times daily as needed.     Cholecalciferol (VITAMIN D3) 50 MCG (2000 UT) capsule Take by mouth.     cyanocobalamin 1000 MCG tablet Take 1 tablet by mouth daily.     folic acid (FOLVITE) 1 MG tablet      hydroxychloroquine (PLAQUENIL) 200 MG tablet Take 200 mg by mouth daily.     letrozole (FEMARA) 2.5 MG tablet Take  1 tablet (2.5 mg total) by mouth daily. 90 tablet 3   methotrexate (RHEUMATREX) 2.5 MG tablet Takes on Monday weekly     metoprolol succinate (TOPROL-XL) 25 MG 24 hr tablet 12.5 mg at bedtime.     OVER THE COUNTER MEDICATION Take 1 capsule by mouth 3 (three) times a week. beta-carotene,A,-vits C,E/mins (OCUVITE ORAL)     No current facility-administered medications for this visit.    Allergies  Allergen Reactions   Emend [Fosaprepitant Dimeglumine] Other (See Comments)    Flushing; see note from 03/01/2022 at 0937    Sulfamethoxazole Itching and Rash    Review of Systems:  neg     Physical Exam:    There were no vitals taken for this visit. Wt Readings from Last 3 Encounters:  12/06/22 132 lb 8 oz (60.1 kg)  10/24/22 130 lb 6 oz (59.1 kg)  08/05/22 133 lb 14.4 oz (60.7 kg)    Constitutional:  Well-developed, in no acute distress. Psychiatric: Normal mood and affect. Behavior is normal. Cardiovascular: Normal rate, regular rhythm. No edema Pulmonary/chest: Effort normal and breath sounds normal. No wheezing, rales or rhonchi. Abdominal: Soft, nondistended. Nontender. Bowel sounds active throughout. There are no masses palpable. No hepatomegaly. Rectal: Deferred Neurological: Alert and oriented to person place and time. Skin: Skin is warm and dry. No rashes noted.  Data Reviewed: I have personally reviewed following labs and imaging studies  CBC:    Latest Ref Rng & Units 10/24/2022    3:46 PM 10/05/2022   12:00 AM 06/03/2022   12:00 AM  CBC  WBC 4.0 - 10.5 K/uL 9.2  6.5     9.2      Hemoglobin 12.0 - 15.0 g/dL 13.3  13.4     10.0      Hematocrit 36.0 - 46.0 % 39.4  39     30      Platelets 150.0 - 400.0 K/uL 291.0  218     194         This result is from an external source.    CMP:    Latest Ref Rng & Units 10/24/2022    3:46 PM 10/05/2022   12:00 AM 06/03/2022   12:00 AM  CMP  Glucose 70 - 99 mg/dL 106     BUN 6 - 23 mg/dL '17  12     9      '$ Creatinine 0.40 - 1.20 mg/dL 0.81  0.7     0.6      Sodium 135 - 145 mEq/L 140  139     139      Potassium 3.5 - 5.1 mEq/L 4.3  4.4     3.9      Chloride 96 - 112 mEq/L 103  104     105      CO2 19 - 32 mEq/L '27  30     29      '$ Calcium 8.4 - 10.5 mg/dL 9.6  9.5     8.8      Total Protein 6.0 - 8.3 g/dL 6.8     Total Bilirubin 0.2 - 1.2 mg/dL 0.4     Alkaline Phos 39 - 117 U/L 61  72     95      AST 0 - 37 U/L '16  29     30      '$ ALT 0 - 35 U/L 16  16     30  This result is from an external source.      Carmell Austria, MD 02/03/2023, 11:00 AM  Cc: Gweneth Fritter, FNP

## 2023-02-03 NOTE — Patient Instructions (Signed)
_______________________________________________________  If your blood pressure at your visit was 140/90 or greater, please contact your primary care physician to follow up on this.  _______________________________________________________  If you are age 65 or older, your body mass index should be between 23-30. Your Body mass index is 20.91 kg/m. If this is out of the aforementioned range listed, please consider follow up with your Primary Care Provider.  If you are age 41 or younger, your body mass index should be between 19-25. Your Body mass index is 20.91 kg/m. If this is out of the aformentioned range listed, please consider follow up with your Primary Care Provider.   ________________________________________________________  The [facility] providers would like to encourage you to use [facility] to communicate with providers for non-urgent requests or questions.  Due to long hold times on the telephone, sending your provider a message by [facility] may be a faster and more efficient way to get a response.  Please allow 48 business hours for a response.  Please remember that this is for non-urgent requests.  _______________________________________________________  Please call with any questions or concerns.  Thank you,  Dr. Jackquline Denmark

## 2023-03-23 ENCOUNTER — Telehealth: Payer: Self-pay | Admitting: Hematology and Oncology

## 2023-03-23 NOTE — Telephone Encounter (Signed)
03/23/23 Spoke with patient and scheduled add on appt for rash.

## 2023-03-24 ENCOUNTER — Encounter: Payer: Self-pay | Admitting: Hematology and Oncology

## 2023-03-24 ENCOUNTER — Inpatient Hospital Stay: Payer: Medicare Other | Attending: Hematology and Oncology | Admitting: Hematology and Oncology

## 2023-03-24 VITALS — BP 131/69 | HR 66 | Temp 97.4°F | Resp 18 | Ht 67.0 in | Wt 133.0 lb

## 2023-03-24 DIAGNOSIS — C50112 Malignant neoplasm of central portion of left female breast: Secondary | ICD-10-CM | POA: Diagnosis not present

## 2023-03-24 DIAGNOSIS — Z170 Estrogen receptor positive status [ER+]: Secondary | ICD-10-CM | POA: Diagnosis not present

## 2023-03-24 DIAGNOSIS — L299 Pruritus, unspecified: Secondary | ICD-10-CM

## 2023-03-24 MED ORDER — HYDROXYZINE HCL 10 MG PO TABS: 10.0000 mg | ORAL_TABLET | Freq: Three times a day (TID) | ORAL | 0 refills | Status: DC | PRN

## 2023-03-24 NOTE — Progress Notes (Cosign Needed)
[facility]  [address] [place],  [place]  [address] [phone]  Clinic Day:  03/24/2023  Referring physician: Audie Pinto, FNP   HISTORY OF PRESENT ILLNESS:  The patient is a 67 y.o. female with stage IIIA hormone positive breast cancer.  She is taking abemaciclib/letrozole for her adjuvant endocrine therapy.  She is currently on abemaciclib 50 mg BID due to higher doses causing diarrhea.  She is added to the schedule today as she telephoned yesterday reporting rash.  She states that she mainly has severe itching especially of the proximal arms and legs, as well as back.  She has tried Benadryl, Claritin, Zyrtec, Allegra, topical Benadryl cream and Aveeno baths without resolution of her symptoms. She denies any new medications.  She denies changes in soap, shampoo/conditioner, detergent or fabric softener.   In regards to her other breast cancer treatment, prior to left mastectomy, she underwent neoadjuvant dose dense chemotherapy, which consisted of AC x 4, followed by paclitaxel x 4.  Due to having fairly significant residual disease seen with her surgical pathology, she is taking abemaciclib/letrozole for her adjuvant endocrine therapy.   PHYSICAL EXAM:  Blood pressure 131/69, pulse 66, temperature (!) 97.4 F (36.3 C), temperature source Oral, resp. rate 18, height  (1.702 m), weight 133 lb (60.3 kg), SpO2 100 %. Wt Readings from Last 3 Encounters:  03/24/23 133 lb (60.3 kg)  02/03/23 133 lb 8 oz (60.6 kg)  12/06/22 132 lb 8 oz (60.1 kg)   Body mass index is 20.83 kg/m.  Performance status (ECOG): 1 - Symptomatic but completely ambulatory  Physical Exam Vitals and nursing note reviewed.  Constitutional:      General: She is not in acute distress.    Appearance: Normal appearance.  HENT:     Head: Normocephalic and atraumatic.     Mouth/Throat:     Mouth: Mucous membranes are moist.     Pharynx: Oropharynx is clear. No  oropharyngeal exudate or posterior oropharyngeal erythema.  Eyes:     General: No scleral icterus.    Extraocular Movements: Extraocular movements intact.     Conjunctiva/sclera: Conjunctivae normal.     Pupils: Pupils are equal, round, and reactive to light.  Cardiovascular:     Rate and Rhythm: Normal rate and regular rhythm.     Heart sounds: Normal heart sounds. No murmur heard.    No friction rub. No gallop.  Pulmonary:     Effort: Pulmonary effort is normal.     Breath sounds: Normal breath sounds. No wheezing, rhonchi or rales.  Abdominal:     General: There is no distension.     Palpations: Abdomen is soft. There is no mass.     Tenderness: There is no abdominal tenderness.  Musculoskeletal:        General: Normal range of motion.     Cervical back: Normal range of motion and neck supple. No tenderness.     Right lower leg: No edema.     Left lower leg: No edema.  Lymphadenopathy:     Cervical: No cervical adenopathy.  Skin:    General: Skin is warm and dry.     Coloration: Skin is not jaundiced.     Findings: Rash (Scattered pinpoint erythematous lesions of the proximal arms, upper back and proximal legs.) present.  Neurological:     Mental Status: She is alert and oriented to person, place, and time.     Cranial Nerves: No cranial nerve  deficit.  Psychiatric:        Mood and Affect: Mood normal.        Behavior: Behavior normal.        Thought Content: Thought content normal.     LABS:      Latest Ref Rng & Units 10/24/2022    3:46 PM 10/05/2022   12:00 AM 06/03/2022   12:00 AM  CBC  WBC 4.0 - 10.5 K/uL 9.2  6.5     9.2      Hemoglobin 12.0 - 15.0 g/dL 60.4  54.0     98.1      Hematocrit 36.0 - 46.0 % 39.4  39     30      Platelets 150.0 - 400.0 K/uL 291.0  218     194         This result is from an external source.      Latest Ref Rng & Units 10/24/2022    3:46 PM 10/05/2022   12:00 AM 06/03/2022   12:00 AM  CMP  Glucose 70 - 99 mg/dL 191     BUN 6 -  23 mg/dL 17  12     9       Creatinine 0.40 - 1.20 mg/dL 4.78  0.7     0.6      Sodium 135 - 145 mEq/L 140  139     139      Potassium 3.5 - 5.1 mEq/L 4.3  4.4     3.9      Chloride 96 - 112 mEq/L 103  104     105      CO2 19 - 32 mEq/L 27  30     29       Calcium 8.4 - 10.5 mg/dL 9.6  9.5     8.8      Total Protein 6.0 - 8.3 g/dL 6.8     Total Bilirubin 0.2 - 1.2 mg/dL 0.4     Alkaline Phos 39 - 117 U/L 61  72     95      AST 0 - 37 U/L 16  29     30       ALT 0 - 35 U/L 16  16     30          This result is from an external source.     No results found for: "CEA1", "CEA" / No results found for: "CEA1", "CEA" No results found for: "PSA1" No results found for: "GNF621" No results found for: "CAN125"  No results found for: "TOTALPROTELP", "ALBUMINELP", "A1GS", "A2GS", "BETS", "BETA2SER", "GAMS", "MSPIKE", "SPEI" Lab Results  Component Value Date   TIBC 324.8 10/24/2022   FERRITIN 124.5 10/24/2022   IRONPCTSAT 22.2 10/24/2022   No results found for: "LDH"     Component Value Date/Time   IGASERUM 122 10/24/2022 1546    Review Flowsheet       Latest Ref Rng & Units 10/24/2022  Oncology Labs  Ferritin 10.0 - 291.0 ng/mL 124.5   %SAT 20.0 - 50.0 % 22.2   Immunoglobulin A 70 - 320 mg/dL 308      STUDIES:  No results found.    ASSESSMENT & PLAN:   Assessment/Plan:  67 y.o. female with stage IIIA hormone receptor positive left breast cancer.  She is on adjuvant endocrine therapy with  abemaciclib/letrozole.  She is experiencing fairly generalized pruritus with mild rash most likely secondary to abemaciclib.  I will give her hydroxyzine  for the pruritus.  If this is not effective or she has worsening rash, she will contact us. She knows to continue moisturizers. The patient understands all the plans discussed today and is in agreement with them.  She knows to contact our office if she develops concerns prior to her next appointment    Adah Perl, PA-C

## 2023-03-28 ENCOUNTER — Telehealth: Payer: Self-pay

## 2023-03-28 NOTE — Telephone Encounter (Signed)
Patient left a message taking atarax since Friday still itching meds  not helping.

## 2023-03-31 ENCOUNTER — Telehealth: Payer: Self-pay

## 2023-03-31 NOTE — Telephone Encounter (Signed)
Patient voiced itching better , not gone but less intense. Continue to hold abemaciclib per Belva Crome PA C , and discuss at follow up appt with Dr Melvyn Neth on Thursday.

## 2023-04-05 NOTE — Progress Notes (Unsigned)
[facility]  [address] [place],  [place]  [address] [phone]  Clinic Day:  04/06/2023  Referring physician: Audie Pinto, FNP   HISTORY OF PRESENT ILLNESS:  The patient is a 67 y.o. female with stage IIIA hormone positive breast cancer.  She underwent neoadjuvant dose dense chemotherapy, which consisted of AC x 4, followed by paclitaxel x 4, followed by a left mastectomy.  Due to having fairly significant residual disease seen with her surgical pathology, she is taking abemaciclib/letrozole for her adjuvant endocrine therapy.  She is currently on abemaciclib 50 mg BID due to higher doses causing diarrhea.  Although she is doing better at her current dose, she is having more pruritus.  She definitely believes her abemaciclib is the reason behind this.  She claims to tolerate her letrozole therapy fairly well.  As it pertains to her breast cancer, she denies having any changes which concern her for early disease recurrence.  Of note, her annual mammogram in February 2024 showed no obvious findings which represented disease recurrence; however, a 51-month follow-up mammogram was recommended.    PHYSICAL EXAM:  Blood pressure (!) 140/72, pulse 65, temperature 98.1 F (36.7 C), resp. rate 14, height 5\' 7"  (1.702 m), weight 133 lb 14.4 oz (60.7 kg), SpO2 97 %. Wt Readings from Last 3 Encounters:  04/06/23 133 lb 14.4 oz (60.7 kg)  03/24/23 133 lb (60.3 kg)  02/03/23 133 lb 8 oz (60.6 kg)   Body mass index is 20.97 kg/m.  Performance status (ECOG): 0 - Asymptomatic  Physical Exam Vitals and nursing note reviewed.  Constitutional:      General: She is not in acute distress.    Appearance: Normal appearance.  HENT:     Head: Normocephalic and atraumatic.     Mouth/Throat:     Mouth: Mucous membranes are moist.     Pharynx: Oropharynx is clear. No oropharyngeal exudate or posterior oropharyngeal erythema.  Eyes:     General: No scleral icterus.     Extraocular Movements: Extraocular movements intact.     Conjunctiva/sclera: Conjunctivae normal.     Pupils: Pupils are equal, round, and reactive to light.  Cardiovascular:     Rate and Rhythm: Normal rate and regular rhythm.     Heart sounds: Normal heart sounds. No murmur heard.    No friction rub. No gallop.  Pulmonary:     Effort: Pulmonary effort is normal.     Breath sounds: Normal breath sounds. No wheezing, rhonchi or rales.  Chest:  Breasts:    Right: Normal.     Left: Absent.  Abdominal:     General: There is no distension.     Palpations: Abdomen is soft. There is no hepatomegaly, splenomegaly or mass.     Tenderness: There is no abdominal tenderness.  Musculoskeletal:        General: Normal range of motion.     Cervical back: Normal range of motion and neck supple. No tenderness.     Right lower leg: No edema.     Left lower leg: No edema.  Lymphadenopathy:     Cervical: No cervical adenopathy.     Upper Body:     Right upper body: No supraclavicular or axillary adenopathy.     Left upper body: No supraclavicular or axillary adenopathy.     Lower Body: No right inguinal adenopathy. No left inguinal adenopathy.  Skin:    General: Skin is warm and dry.  Coloration: Skin is not jaundiced.     Findings: No rash.  Neurological:     Mental Status: She is alert and oriented to person, place, and time.     Cranial Nerves: No cranial nerve deficit.  Psychiatric:        Mood and Affect: Mood normal.        Behavior: Behavior normal.        Thought Content: Thought content normal.    PATHOLOGY:  Her left mastectomy pathology (after neoadjuvant chemotherapy) revealed the following:    ASSESSMENT & PLAN:  Assessment/Plan:  A 67 y.o. female with stage IIIA hormone receptor positive breast cancer, status post neoadjuvant chemotherapy, followed by a left mastectomy in August 2023.  Due to the pruritus she is having, which she attributes to her abemaciclib, her  abemaciclib dose will be decreased to 50 mg nightly.  She will continue to take her letrozole on a daily basis.  I will see her back in 4 months for her next clinical breast exam.  Her annual mammogram will be scheduled before her next visit for her continued radiographic breast cancer surveillance.  I will also ensure a bone density study is done around the same time to ensure there has been no significant bone loss while on her endocrine therapy.  The patient understands all the plans discussed today and is in agreement with them.    Ranika Mcniel Kirby Funk, MD

## 2023-04-06 ENCOUNTER — Inpatient Hospital Stay: Payer: Medicare Other | Attending: Oncology | Admitting: Oncology

## 2023-04-06 ENCOUNTER — Other Ambulatory Visit: Payer: Self-pay | Admitting: Oncology

## 2023-04-06 VITALS — BP 140/72 | HR 65 | Temp 98.1°F | Resp 14 | Ht 67.0 in | Wt 133.9 lb

## 2023-04-06 DIAGNOSIS — Z170 Estrogen receptor positive status [ER+]: Secondary | ICD-10-CM

## 2023-04-06 DIAGNOSIS — C50311 Malignant neoplasm of lower-inner quadrant of right female breast: Secondary | ICD-10-CM | POA: Diagnosis not present

## 2023-04-06 DIAGNOSIS — Z171 Estrogen receptor negative status [ER-]: Secondary | ICD-10-CM | POA: Diagnosis not present

## 2023-04-06 DIAGNOSIS — Z09 Encounter for follow-up examination after completed treatment for conditions other than malignant neoplasm: Secondary | ICD-10-CM

## 2023-04-07 ENCOUNTER — Telehealth: Payer: Self-pay | Admitting: Oncology

## 2023-04-07 NOTE — Telephone Encounter (Signed)
Contacted pt to schedule an appt per 04/06/23 LOS. Unable to reach via phone, voicemail was left.  

## 2023-04-10 ENCOUNTER — Telehealth: Payer: Self-pay | Admitting: Pharmacy Technician

## 2023-04-10 NOTE — Telephone Encounter (Signed)
Oral Oncology Patient Advocate Encounter  Received call from patient regarding a letter she received requesting payment for a Verzenio claim filed to [facility] on 01/15/23.  Patient has been enrolled in [employer] since 01/08/23 and receives their medication through this program.  I followed up with patient's previous pharmacy, [facility], and confirmed they had a claim stuck from 01/15/23 for the Verzenio. I requested them to reverse this claim as no medication ever went out to the patient.  The claim was reversed and the [facility] rep, Lanora Manis, stated she would close the patient's chart since she no longer fills at [facility].  I shared this information with the patient. I advised her to wait at least 24 hours for the reversal to be entered into [employer] system before calling them to confirm they show the reversal of the charge.    Jinger Neighbors, CPhT-Adv Oncology Pharmacy Patient Advocate [facility] Direct Number: [phone]  Fax: [phone]

## 2023-05-30 ENCOUNTER — Other Ambulatory Visit (HOSPITAL_COMMUNITY): Payer: Self-pay

## 2023-05-30 ENCOUNTER — Telehealth: Payer: Self-pay | Admitting: Pharmacy Technician

## 2023-05-30 NOTE — Telephone Encounter (Signed)
Oral Oncology Patient Advocate Encounter  Received call from patient stating they received an additional letter from debt collections stating they owe $7463.08 from a claim dated  01/15/23 on her [employer] commercial plan.  The claim was previously stuck in [facility]'s system. It was manually reversed by [facility] rep, Lanora Manis, on 04/10/23.  I called the patient's [employer] plan, [phone], to confirm the reversal processed in their system and to see if the patient's account is now up to date.   The representative at [facility] informed me the provider's line services do not have access to billing and delinquency issues. The patient must call themselves at [phone].  I have spoken with the patient to let them know.   I advised patient to call me back if there wsa further information I can follow up on.  Jinger Neighbors, CPhT-Adv Oncology Pharmacy Patient Advocate [facility] Direct Number: [phone]  Fax: [phone]

## 2023-05-30 NOTE — Telephone Encounter (Signed)
Oral Oncology Patient Advocate Encounter  Called [facility] to check status of patient's account. Representative, Naomi A., confirmed they show a $0 balance as of 01/29/23.  Jinger Neighbors, CPhT-Adv Oncology Pharmacy Patient Advocate [facility] Direct Number: [phone]  Fax: [phone]

## 2023-05-31 NOTE — Telephone Encounter (Signed)
Oral Oncology Patient Advocate Encounter  Patient returned call and said she spoke with [employer] and they clarified everything and provided her with a reference number to give to the debt collector.  I advised patient to let me know if there was anything I could help her with in the future.  Jinger Neighbors, CPhT-Adv Oncology Pharmacy Patient Advocate [facility] Direct Number: [phone]  Fax: [phone]

## 2023-08-13 ENCOUNTER — Other Ambulatory Visit: Payer: Self-pay | Admitting: Oncology

## 2023-09-22 ENCOUNTER — Other Ambulatory Visit: Payer: Self-pay | Admitting: Oncology

## 2023-10-05 ENCOUNTER — Other Ambulatory Visit (HOSPITAL_COMMUNITY): Payer: Self-pay

## 2023-10-05 ENCOUNTER — Telehealth: Payer: Self-pay | Admitting: Pharmacy Technician

## 2023-10-05 NOTE — Telephone Encounter (Addendum)
Oral Oncology Patient Advocate Encounter   Received notification that prior authorization for Verzenio is required.   PA submitted on 10/05/23 Key [id number] Status is pending  Oral Oncology Patient Advocate Encounter  Prior Authorization for Kathlen Mody has been approved.    PA# [id number] Effective dates: 10/06/23 through 12/04/98  Patients co-pay is $3,107.40.      Jinger Neighbors, CPhT-Adv Oncology Pharmacy Patient Advocate [facility] Direct Number: [phone]  Fax: [phone]

## 2023-10-05 NOTE — Telephone Encounter (Signed)
Oral Oncology Patient Advocate Encounter   Received notification that patient is due for re-enrollment for assistance for Verzenio through [employer].   Re-enrollment process has been initiated and will be submitted upon completion of necessary documents.  [employer] number [phone].   I will continue to follow until final determination.  Alexandra Singh, CPhT-Adv Oncology Pharmacy Patient [place] Direct Number: [phone]  Fax: [phone]

## 2023-10-05 NOTE — Telephone Encounter (Signed)
Oral Oncology Patient Advocate Encounter  Spoke with patient regarding re-enrollment. Patient would like to discuss remaining length of therapy with Dr. Melvyn Neth at her next office visit, 10/09/23, prior to signing renewal forms.  I will follow back up after the appointment to see what decision was made and collect signatures if needed.  Jinger Neighbors, CPhT-Adv Oncology Pharmacy Patient Advocate [facility] Direct Number: [phone]  Fax: [phone]

## 2023-10-06 ENCOUNTER — Other Ambulatory Visit (HOSPITAL_COMMUNITY): Payer: Self-pay

## 2023-10-08 NOTE — Progress Notes (Unsigned)
[facility]  [address] [place],  [place]  [address] [phone]  Clinic Day:  10/09/2023  Referring physician: Audie Pinto, FNP   HISTORY OF PRESENT ILLNESS:  The patient is a 67 y.o. female with stage IIIA hormone positive breast cancer.  She underwent neoadjuvant dose dense chemotherapy, which consisted of AC x 4, followed by paclitaxel x 4, followed by a left mastectomy in August 2023.  Due to having fairly significant residual disease seen with her surgical pathology, she is taking abemaciclib/letrozole for her adjuvant endocrine therapy.  She is currently on abemaciclib 50 mg QD due to higher doses causing diarrhea and abdominal discomfort.  Although she is doing better at her current dose, she still has intermittent abdominal discomfort.  Her diarrhea has ceased.   She claims to tolerate her letrozole therapy fairly well.  As it pertains to her breast cancer, she denies having any changes which concern her for early disease recurrence.  Of note, her 89-month follow-up mammogram in August 2024 showed no obvious findings which represented disease recurrence.  However, another 40-month follow-up mammogram was recommended.    PHYSICAL EXAM:  Blood pressure 125/65, pulse 62, temperature 98.2 F (36.8 C), resp. rate 14, height 5\' 7"  (1.702 m), weight 133 lb 4.8 oz (60.5 kg), SpO2 100%. Wt Readings from Last 3 Encounters:  10/09/23 133 lb 4.8 oz (60.5 kg)  04/06/23 133 lb 14.4 oz (60.7 kg)  03/24/23 133 lb (60.3 kg)   Body mass index is 20.88 kg/m.  Performance status (ECOG): 0 - Asymptomatic  Physical Exam Vitals and nursing note reviewed.  Constitutional:      General: She is not in acute distress.    Appearance: Normal appearance.  HENT:     Head: Normocephalic and atraumatic.     Mouth/Throat:     Mouth: Mucous membranes are moist.     Pharynx: Oropharynx is clear. No oropharyngeal exudate or posterior oropharyngeal erythema.  Eyes:      General: No scleral icterus.    Extraocular Movements: Extraocular movements intact.     Conjunctiva/sclera: Conjunctivae normal.     Pupils: Pupils are equal, round, and reactive to light.  Cardiovascular:     Rate and Rhythm: Normal rate and regular rhythm.     Heart sounds: Normal heart sounds. No murmur heard.    No friction rub. No gallop.  Pulmonary:     Effort: Pulmonary effort is normal.     Breath sounds: Normal breath sounds. No wheezing, rhonchi or rales.  Chest:  Breasts:    Right: Normal.     Left: Absent.  Abdominal:     General: There is no distension.     Palpations: Abdomen is soft. There is no hepatomegaly, splenomegaly or mass.     Tenderness: There is no abdominal tenderness.  Musculoskeletal:        General: Normal range of motion.     Cervical back: Normal range of motion and neck supple. No tenderness.     Right lower leg: No edema.     Left lower leg: No edema.  Lymphadenopathy:     Cervical: No cervical adenopathy.     Upper Body:     Right upper body: No supraclavicular or axillary adenopathy.     Left upper body: No supraclavicular or axillary adenopathy.     Lower Body: No right inguinal adenopathy. No left inguinal adenopathy.  Skin:    General: Skin is warm and dry.  Coloration: Skin is not jaundiced.     Findings: No rash.  Neurological:     Mental Status: She is alert and oriented to person, place, and time.     Cranial Nerves: No cranial nerve deficit.  Psychiatric:        Mood and Affect: Mood normal.        Behavior: Behavior normal.        Thought Content: Thought content normal.    PATHOLOGY:  Her left mastectomy pathology (after neoadjuvant chemotherapy) revealed the following:    ASSESSMENT & PLAN:  Assessment/Plan:  A 67 y.o. female with stage IIIA hormone receptor positive breast cancer, status post neoadjuvant chemotherapy, followed by a left mastectomy in August 2023.  Based upon her clinical breast exam today and her  recent mammogram, the patient remains disease-free.  She will continue to take her abemaciclib at 50 mg nightly.  She will continue to take her letrozole on a daily basis.  Clinically, the patient is doing well.  I will see her back in 4 months for her next clinical breast exam.  Her 24-month follow-up mammogram will be scheduled before her next visit for her continued radiographic breast cancer surveillance. The patient understands all the plans discussed today and is in agreement with them.    Leroy Trim Kirby Funk, MD

## 2023-10-09 ENCOUNTER — Telehealth: Payer: Self-pay | Admitting: Oncology

## 2023-10-09 ENCOUNTER — Inpatient Hospital Stay: Payer: Medicare Other | Attending: Oncology | Admitting: Oncology

## 2023-10-09 VITALS — BP 125/65 | HR 62 | Temp 98.2°F | Resp 14 | Ht 67.0 in | Wt 133.3 lb

## 2023-10-09 DIAGNOSIS — Z170 Estrogen receptor positive status [ER+]: Secondary | ICD-10-CM

## 2023-10-09 DIAGNOSIS — Z79811 Long term (current) use of aromatase inhibitors: Secondary | ICD-10-CM | POA: Insufficient documentation

## 2023-10-09 DIAGNOSIS — C50112 Malignant neoplasm of central portion of left female breast: Secondary | ICD-10-CM | POA: Diagnosis not present

## 2023-10-09 DIAGNOSIS — Z9012 Acquired absence of left breast and nipple: Secondary | ICD-10-CM | POA: Diagnosis not present

## 2023-10-09 DIAGNOSIS — C50919 Malignant neoplasm of unspecified site of unspecified female breast: Secondary | ICD-10-CM | POA: Diagnosis present

## 2023-10-09 NOTE — Telephone Encounter (Signed)
10/09/23 Next appt scheduled and confirmed with patient.

## 2023-10-10 NOTE — Telephone Encounter (Signed)
Oral Oncology Patient Advocate Encounter  Per patient, they are finishing the current medication they have and then stopping therapy. No re-enrollment will be completed at this time.  Alexandra Singh, CPhT-Adv Oncology Pharmacy Patient Advocate [facility] Direct Number: [phone]  Fax: [phone]'

## 2023-10-23 IMAGING — MR MR BREAST BX W/ LOC DEV 1ST LEASION IMAGE BX SPEC MR GUIDE*R*
9 of 12 series · 33 of 48 positions shown · IV contrast (6 ml gadavist)
Comparison: Previous exams.
COMPARISON: Previous exams.

Addendum:
CLINICAL DATA: Patient presents for MR guided core biopsy of non
mass enhancement in the RIGHT breast. Recent diagnosis of LEFT
breast malignancy.

EXAM:
MRI GUIDED CORE NEEDLE BIOPSY OF THE RIGHT BREAST
TECHNIQUE: Multiplanar, multisequence MR imaging of the RIGHT breast was
performed both before and after administration of intravenous
contrast.
CONTRAST:  Six

[Series 2: fiducial unilateral · sagittal · 2.0mm · 1.33mm/px · 2 of 52 slices shown]
[im 1/52]
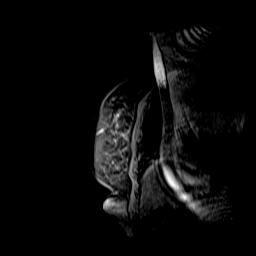
[im 52/52]
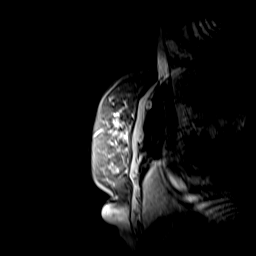

[Series 3: dynamic pre · axial · non-contrast · 1.3mm · 0.73mm/px · z∈[-114,+93]mm · 5 of 160 slices shown]
[im 1/160]
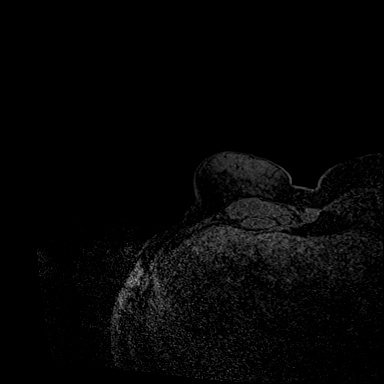
[im 40/160]
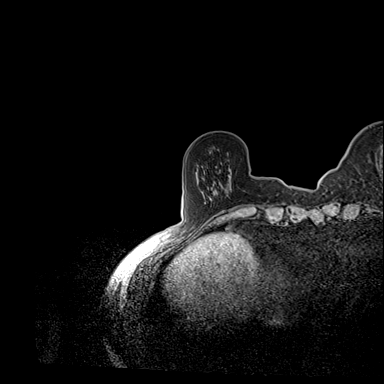
[im 80/160]
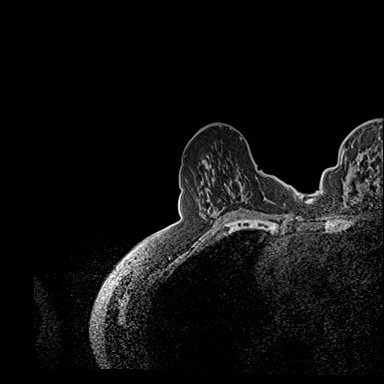
[im 120/160]
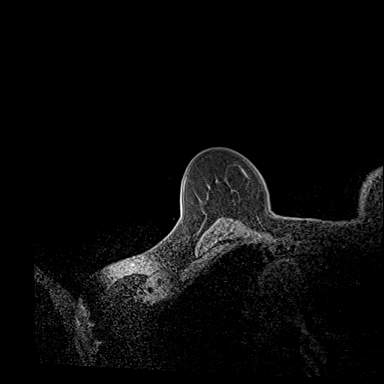
[im 160/160]
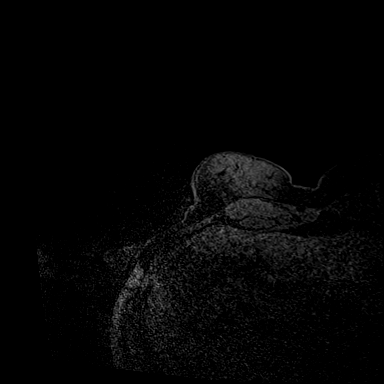

[Series 4: dynamic post 20 · axial · 1.3mm · 0.73mm/px · z∈[-114,+93]mm · 5 of 160 slices shown (1 of 2)]
[im 1/160]
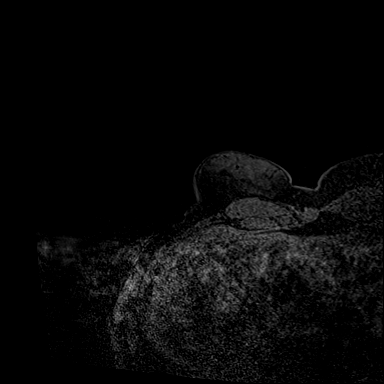
[im 40/160]
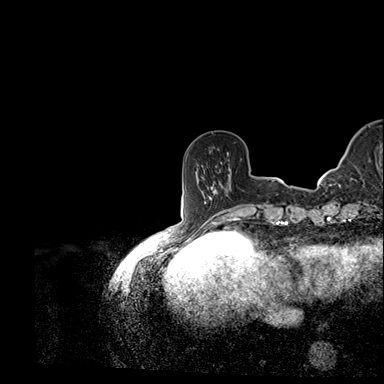
[im 80/160]
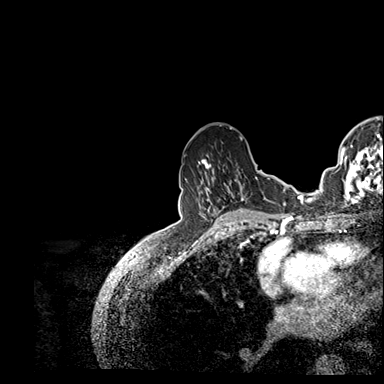
[im 120/160]
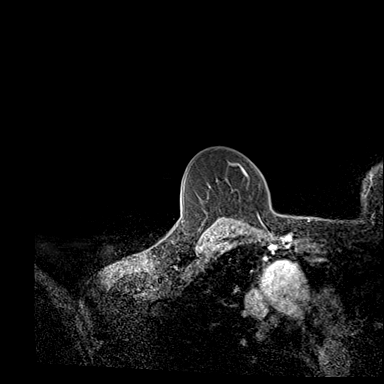
[im 160/160]
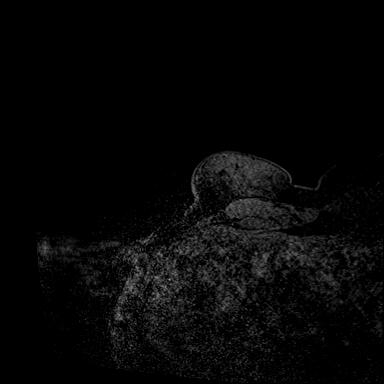

[Series 5: dynamic post 20 · axial · 1.3mm · 0.73mm/px · z∈[-114,+93]mm · 4 of 160 slices shown (2 of 2)]
[im 1/160]
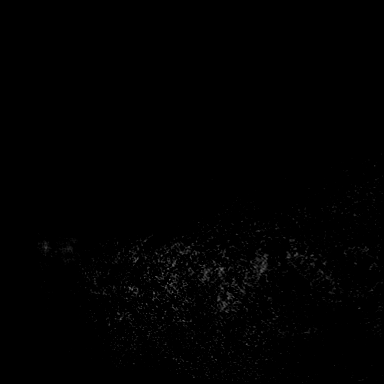
[im 54/160]
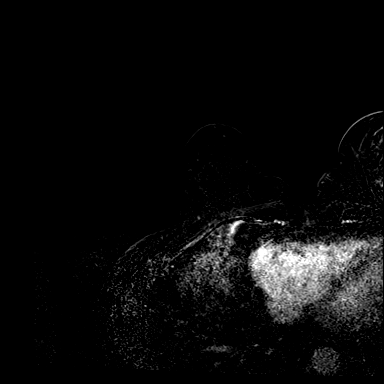
[im 107/160]
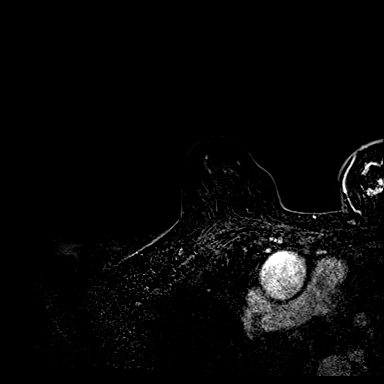
[im 160/160]
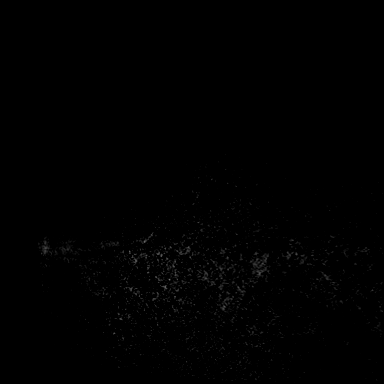

[Series 6: dynamic post 3 · axial · 1.3mm · 0.73mm/px · z∈[-114,+93]mm · 4 of 160 slices shown (1 of 2)]
[im 1/160]
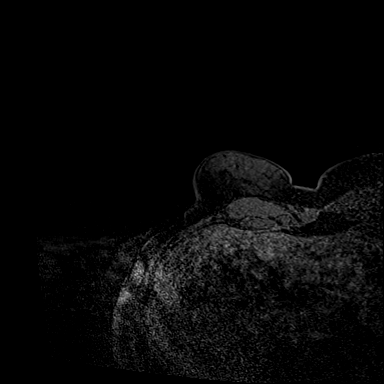
[im 54/160]
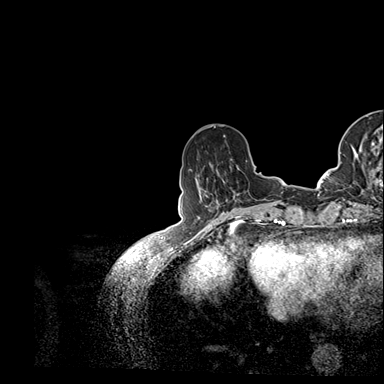
[im 107/160]
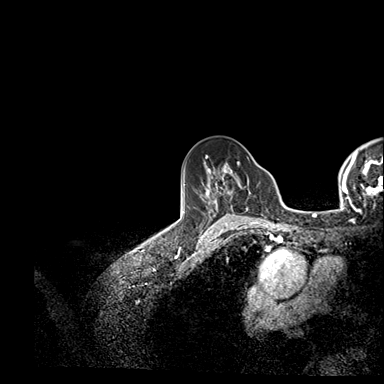
[im 160/160]
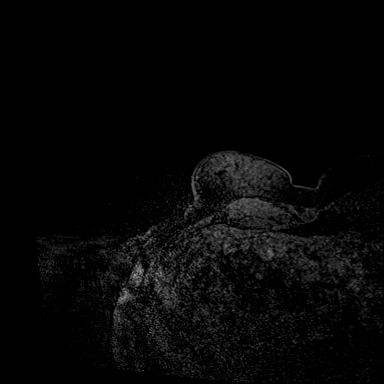

[Series 7: dynamic post 3 · axial · 1.3mm · 0.73mm/px · z∈[-114,+93]mm · 4 of 160 slices shown (2 of 2)]
[im 1/160]
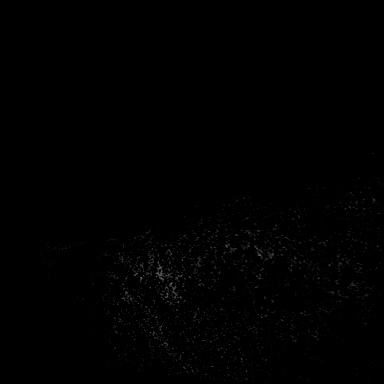
[im 54/160]
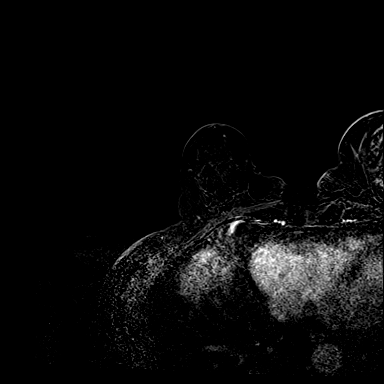
[im 107/160]
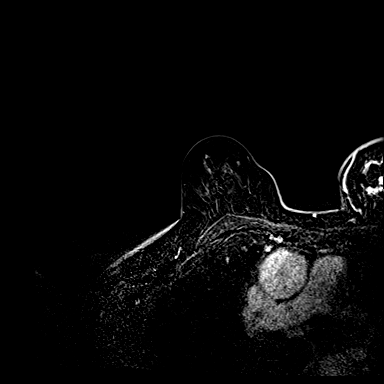
[im 160/160]
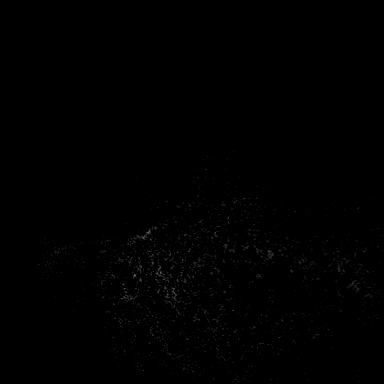

[Series 8: dynamic post 5 · axial · 1.3mm · 0.73mm/px · z∈[-114,+93]mm · 4 of 160 slices shown (1 of 2)]
[im 1/160]
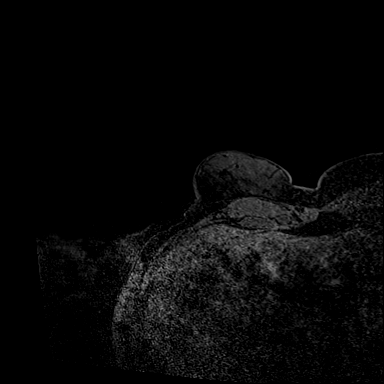
[im 54/160]
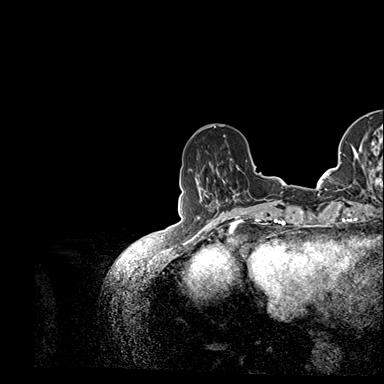
[im 107/160]
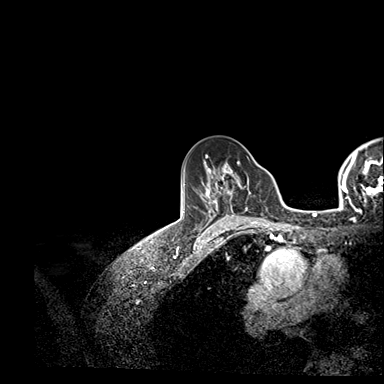
[im 160/160]
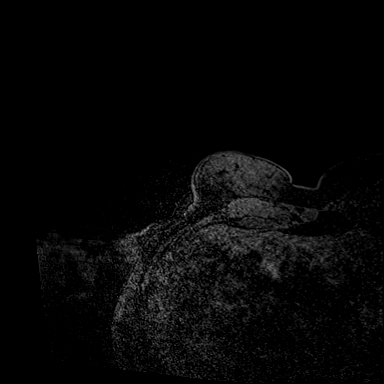

[Series 9: dynamic post 5 · axial · 1.3mm · 0.73mm/px · z∈[-114,+93]mm · 4 of 160 slices shown (2 of 2)]
[im 1/160]
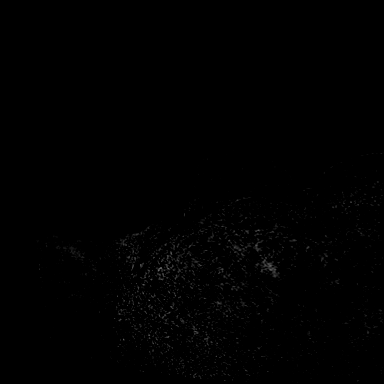
[im 54/160]
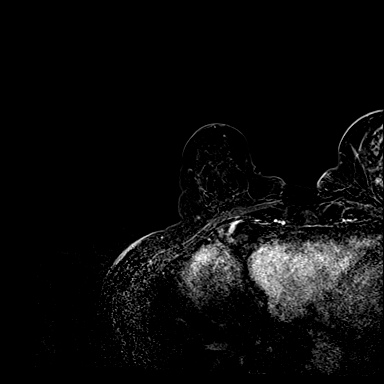
[im 107/160]
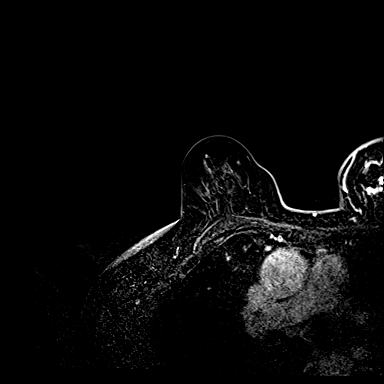
[im 160/160]
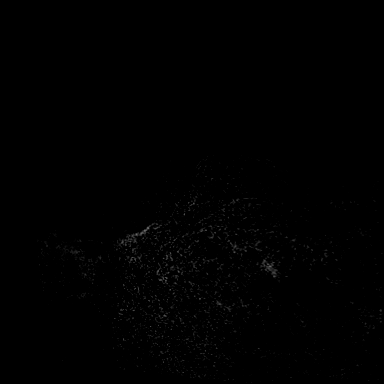

[Series 10: needle confirmation · axial · 1.3mm · 0.73mm/px · 1 of 160 slices shown]
[im 1/160]
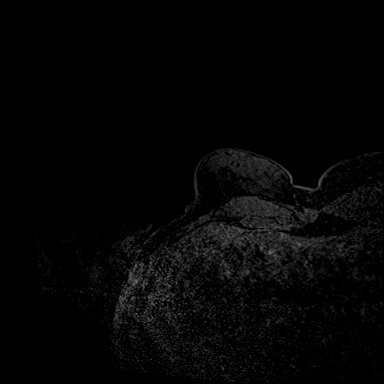

[33 of 48 positions shown; findings below may reference images not displayed]

FINDINGS: I met with the patient, and we discussed the procedure of MRI guided
biopsy, including risks, benefits, and alternatives. Specifically,
we discussed the risks of infection, bleeding, tissue injury, clip
migration, and inadequate sampling. Informed, written consent was
given. The usual time out protocol was performed immediately prior
to the procedure.

Using sterile technique, 1% Lidocaine, MRI guidance, and a 9 gauge
vacuum assisted device, biopsy was performed of non mass enhancement
in the UPPER anterior RIGHT breast using a LATERAL to MEDIAL
approach. At the conclusion of the procedure, a barbell tissue
marker clip was deployed into the biopsy cavity. Follow-up 2-view
mammogram was performed and dictated separately.
IMPRESSION: MRI guided biopsy of RIGHT mass non mass enhancement. No apparent
complications.

ADDENDUM:
Pathology revealed BENIGN BREAST TISSUE WITH COLUMNAR CELL CHANGES
AND INCREASED STROMAL FIBROSIS- NO MALIGNANCY IDENTIFIED of the
RIGHT breast, upper central (barbell clip). This was found to be
concordant by Dr. Frost Durocher.

Pathology results were discussed with the patient by telephone. The
patient reported doing well after the biopsy with tenderness and
bruising at the site. Post biopsy instructions and care were
reviewed and questions were answered. The patient was encouraged to
call The [REDACTED] for any additional
concerns.

The patient has a recent diagnosis of LEFT breast cancer and should
follow her outlined treatment plan.

Pathology results reported by Kicky Kabel RN on 02/14/2022.

*** End of Addendum ***
FINDINGS: I met with the patient, and we discussed the procedure of MRI guided
biopsy, including risks, benefits, and alternatives. Specifically,
we discussed the risks of infection, bleeding, tissue injury, clip
migration, and inadequate sampling. Informed, written consent was
given. The usual time out protocol was performed immediately prior
to the procedure.

Using sterile technique, 1% Lidocaine, MRI guidance, and a 9 gauge
vacuum assisted device, biopsy was performed of non mass enhancement
in the UPPER anterior RIGHT breast using a LATERAL to MEDIAL
approach. At the conclusion of the procedure, a barbell tissue
marker clip was deployed into the biopsy cavity. Follow-up 2-view
mammogram was performed and dictated separately.
IMPRESSION: MRI guided biopsy of RIGHT mass non mass enhancement. No apparent
complications.

## 2023-10-23 IMAGING — MG MM BREAST LOCALIZATION CLIP
4 series · 4 of 12 positions shown · non-contrast
Comparison: Previous exam(s).

CLINICAL DATA: Status post MR guided core biopsy of non mass
enhancement in the RIGHT breast.

EXAM:
3D DIAGNOSTIC RIGHT MAMMOGRAM POST MRI BIOPSY

[R CC synth-2D]
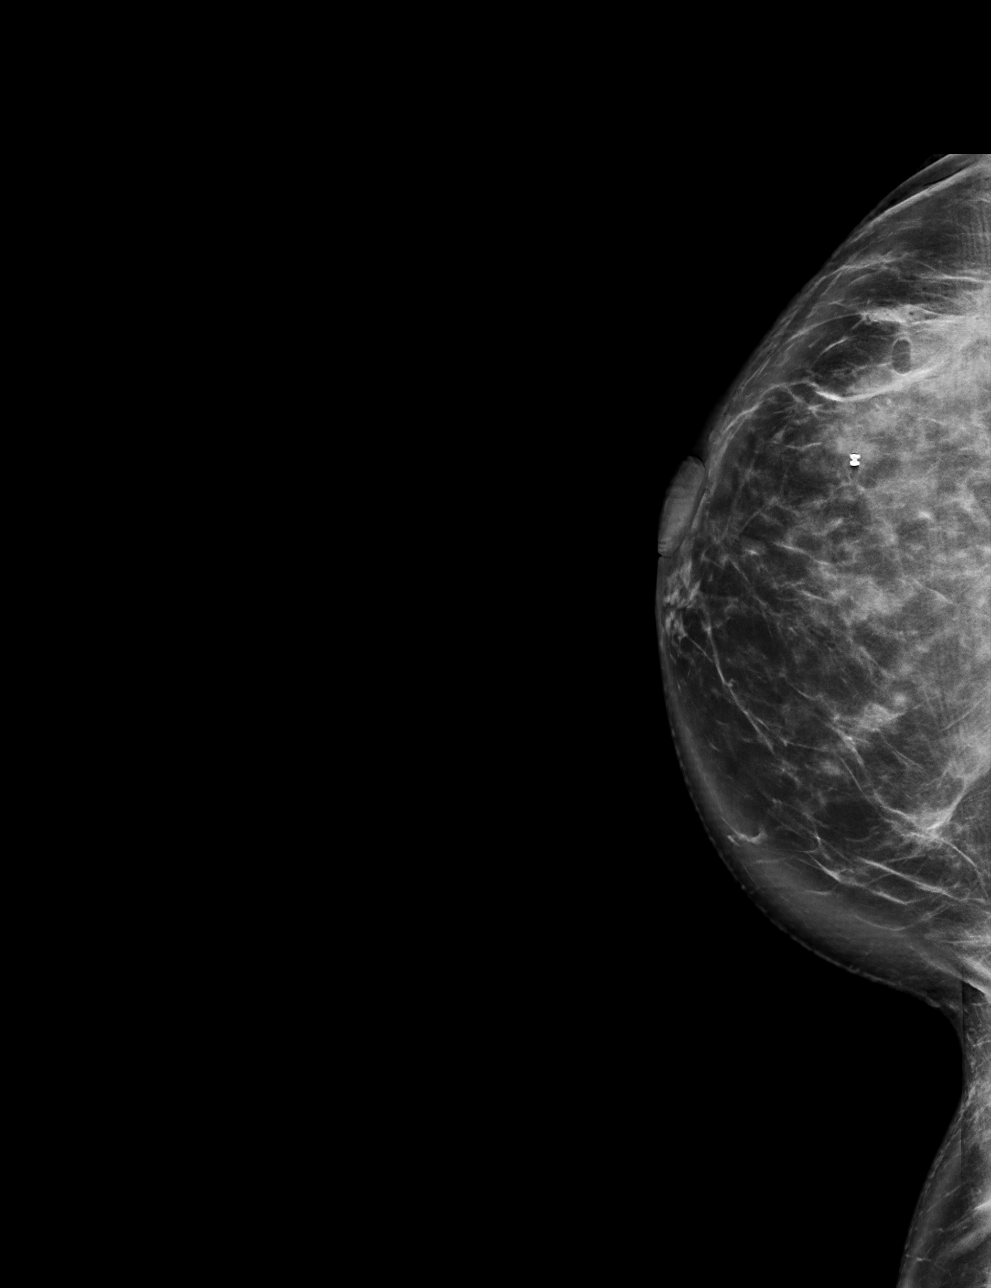

[R ML synth-2D]
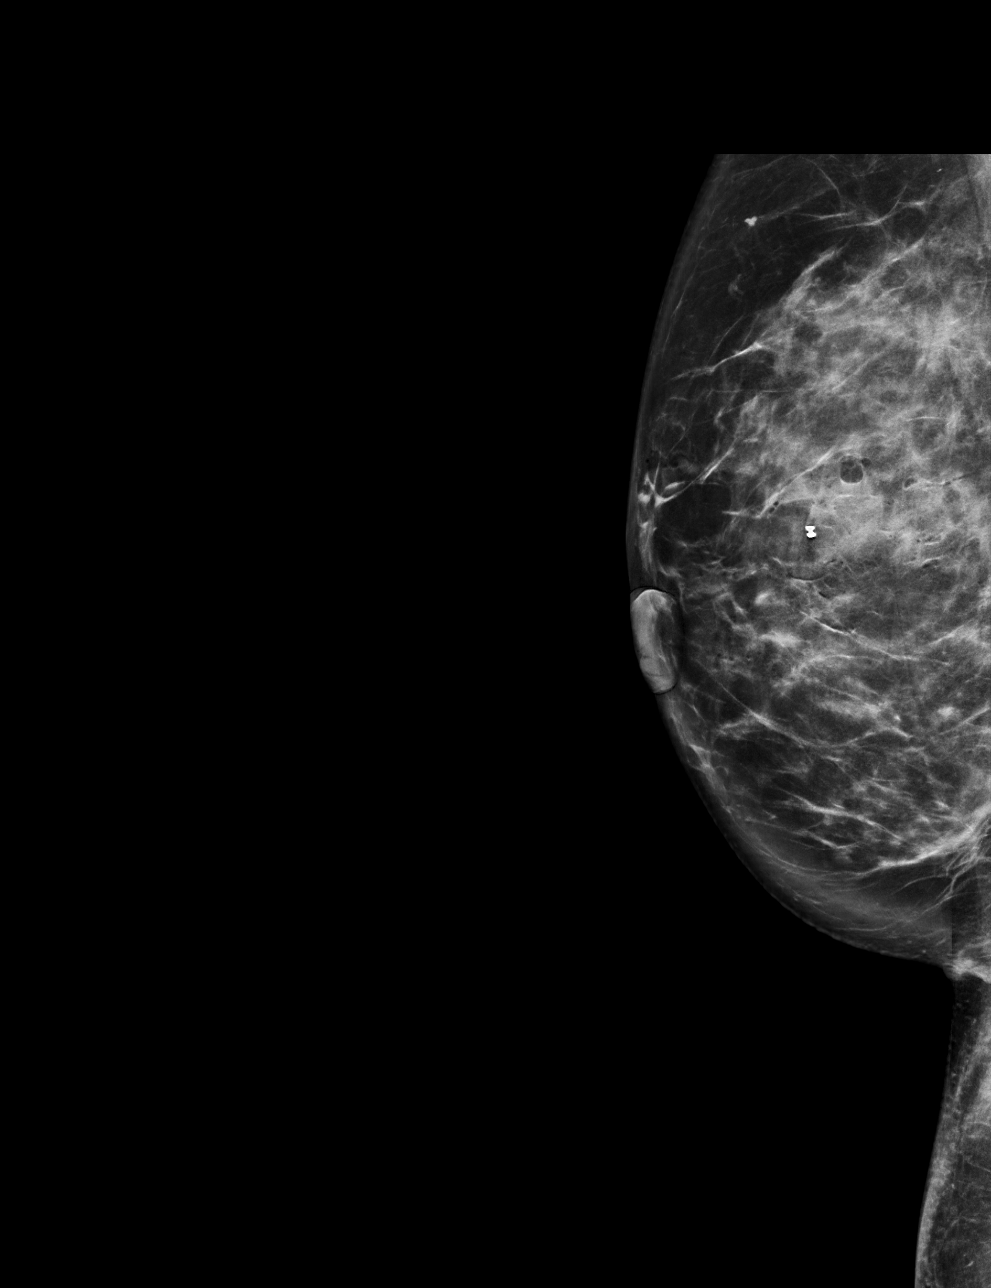

[R CC tomo · tomo slice 47/93.0]
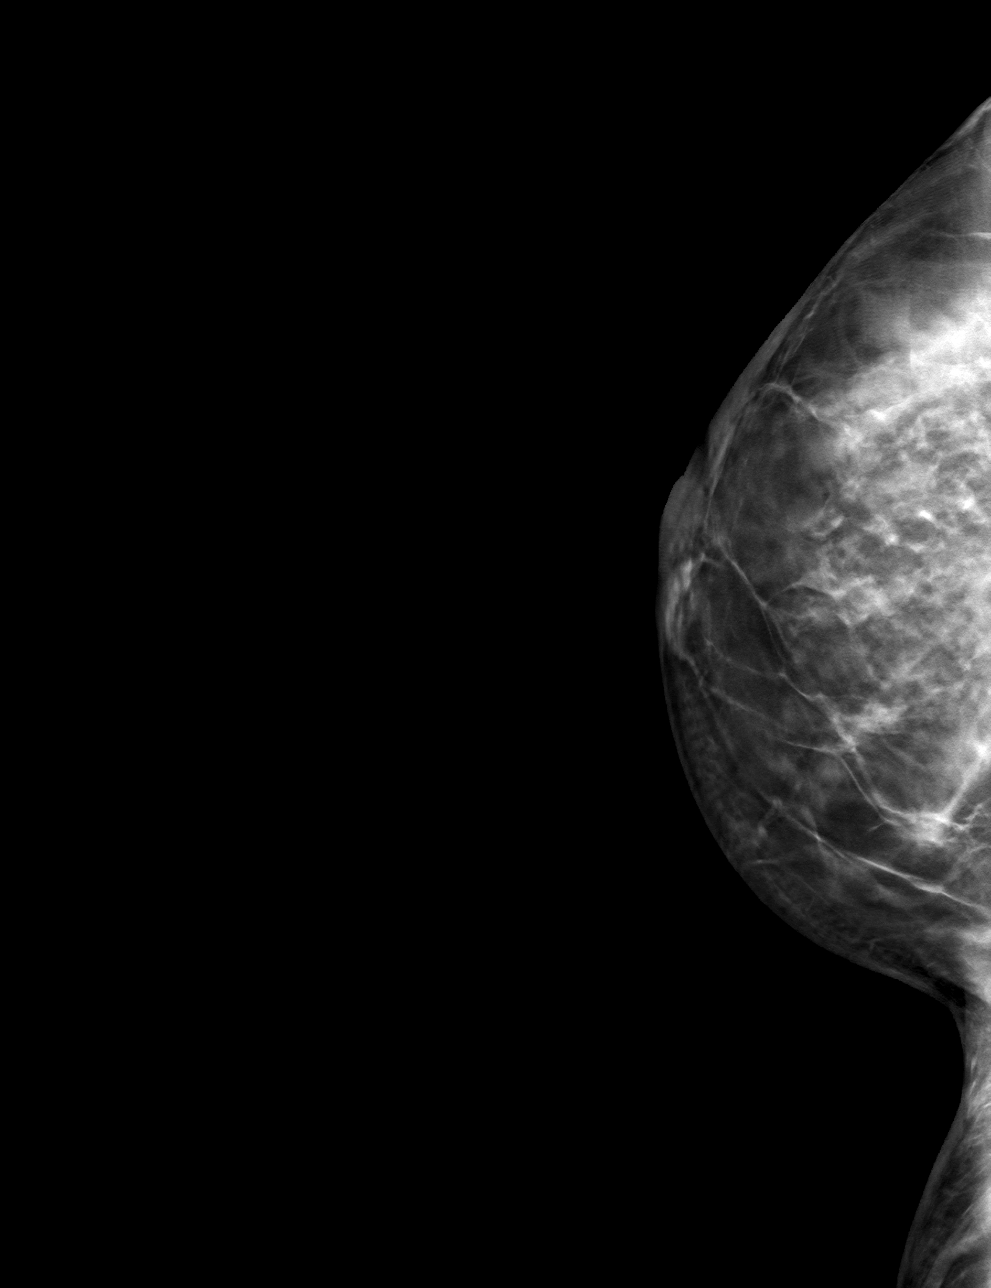

[R ML tomo · tomo slice 42/83.0]
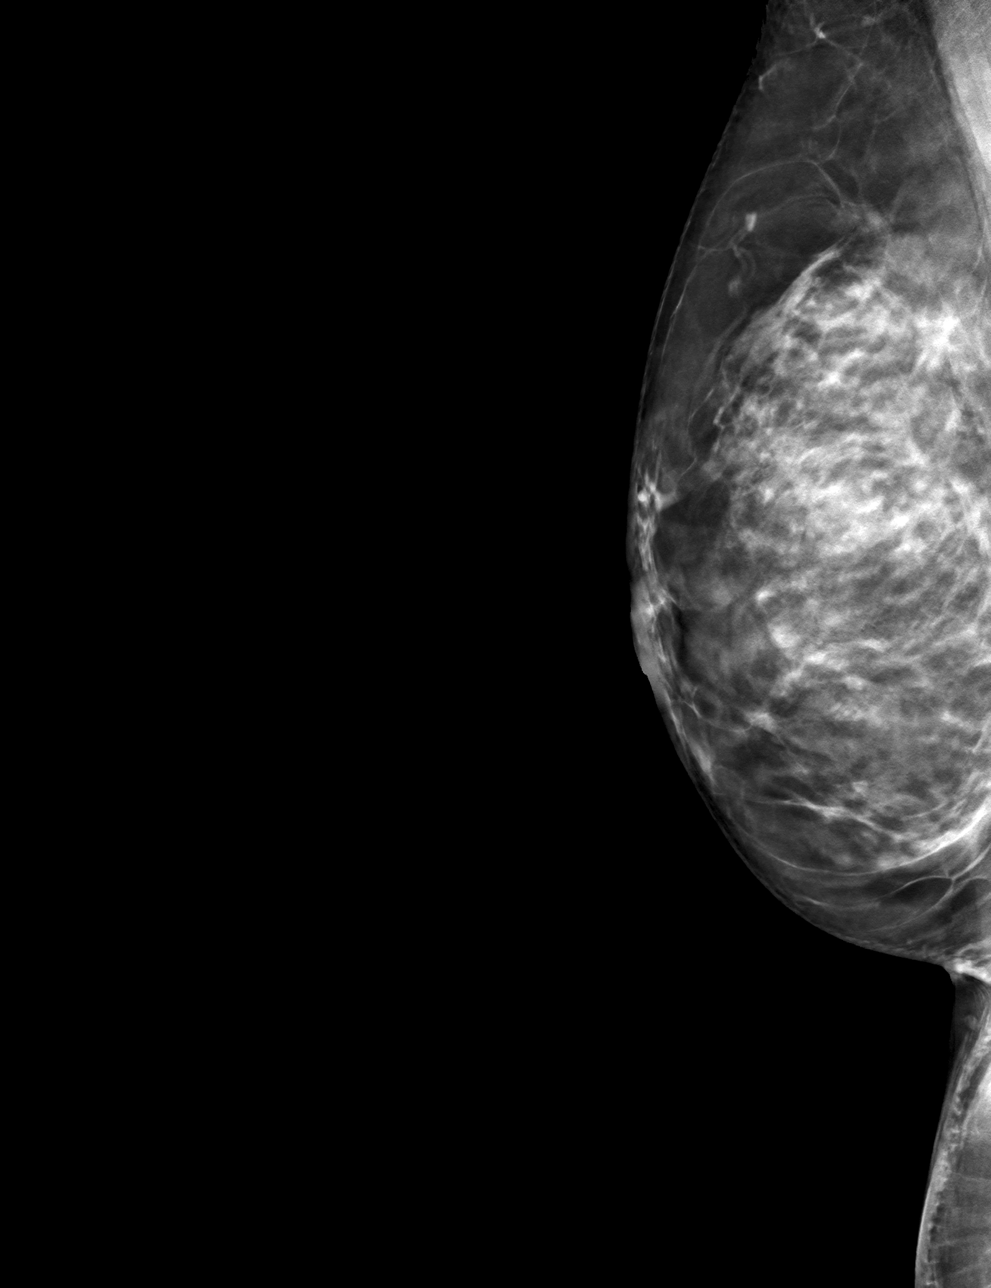

[4 of 12 positions shown; findings below may reference images not displayed]

FINDINGS: 3D Mammographic images were obtained following MRI guided biopsy of
non mass enhancement in the UPPER central RIGHT breast and placement
of a barbell clip. The biopsy marking clip is in expected position
at the site of biopsy.
IMPRESSION: Appropriate positioning of the barbell shaped biopsy marking clip at
the site of biopsy in the UPPER central RIGHT breast.

Final Assessment: Post Procedure Mammograms for Marker Placement

## 2023-11-16 ENCOUNTER — Other Ambulatory Visit: Payer: Self-pay | Admitting: Oncology

## 2023-11-25 ENCOUNTER — Encounter: Payer: Self-pay | Admitting: Oncology

## 2024-02-05 NOTE — Progress Notes (Unsigned)
 [facility]  [address] [place],  [place]  [address] [phone]  Clinic Day:  02/06/2024  Referring physician: Audie Pinto, FNP   HISTORY OF PRESENT ILLNESS:  The patient is a 67 y.o. female with stage IIIA hormone positive breast cancer.  She underwent neoadjuvant dose dense chemotherapy, which consisted of AC x 4, followed by paclitaxel x 4, followed by a left mastectomy in August 2023.  Due to having fairly significant residual disease seen with her surgical pathology, she was taking abemaciclib/letrozole for her adjuvant endocrine therapy.  She is currently on abemaciclib 50 mg QD due to higher doses causing diarrhea and abdominal discomfort.  However, despite taking the abemaciclib at this low dose, her abdominal discomfort and other GI symptoms persisted to where she ultimately decided to discontinue this medicine in November 2024.  Since doing this, all of her GI symptoms have abated.  Of note, she continues to take her letrozole on a daily basis, which she tolerates very well.  As it pertains to her breast cancer, she denies having any changes which concern her for early disease recurrence.  Of note, her  mammogram in August 2024 showed no obvious findings which represented disease recurrence.  A 11-month follow-up mammogram was recommended, but it has not been scheduled until early April 2025.  PHYSICAL EXAM:  Blood pressure 139/80, pulse 64, temperature 99 F (37.2 C), temperature source Oral, resp. rate 14, height 5\' 7"  (1.702 m), weight 133 lb 4.8 oz (60.5 kg), SpO2 100%. Wt Readings from Last 3 Encounters:  02/06/24 133 lb 4.8 oz (60.5 kg)  10/09/23 133 lb 4.8 oz (60.5 kg)  04/06/23 133 lb 14.4 oz (60.7 kg)   Body mass index is 20.88 kg/m.  Performance status (ECOG): 0 - Asymptomatic  Physical Exam Vitals and nursing note reviewed.  Constitutional:      General: She is not in acute distress.    Appearance: Normal appearance.  HENT:      Head: Normocephalic and atraumatic.     Mouth/Throat:     Mouth: Mucous membranes are moist.     Pharynx: Oropharynx is clear. No oropharyngeal exudate or posterior oropharyngeal erythema.  Eyes:     General: No scleral icterus.    Extraocular Movements: Extraocular movements intact.     Conjunctiva/sclera: Conjunctivae normal.     Pupils: Pupils are equal, round, and reactive to light.  Cardiovascular:     Rate and Rhythm: Normal rate and regular rhythm.     Heart sounds: Normal heart sounds. No murmur heard.    No friction rub. No gallop.  Pulmonary:     Effort: Pulmonary effort is normal.     Breath sounds: Normal breath sounds. No wheezing, rhonchi or rales.  Chest:  Breasts:    Right: Normal.     Left: Absent.  Abdominal:     General: There is no distension.     Palpations: Abdomen is soft. There is no hepatomegaly, splenomegaly or mass.     Tenderness: There is no abdominal tenderness.  Musculoskeletal:        General: Normal range of motion.     Cervical back: Normal range of motion and neck supple. No tenderness.     Right lower leg: No edema.     Left lower leg: No edema.  Lymphadenopathy:     Cervical: No cervical adenopathy.     Upper Body:     Right upper body: No supraclavicular or axillary adenopathy.  Left upper body: No supraclavicular or axillary adenopathy.     Lower Body: No right inguinal adenopathy. No left inguinal adenopathy.  Skin:    General: Skin is warm and dry.     Coloration: Skin is not jaundiced.     Findings: No rash.  Neurological:     Mental Status: She is alert and oriented to person, place, and time.     Cranial Nerves: No cranial nerve deficit.  Psychiatric:        Mood and Affect: Mood normal.        Behavior: Behavior normal.        Thought Content: Thought content normal.    PATHOLOGY:  Her left mastectomy pathology (after neoadjuvant chemotherapy) revealed the following:    ASSESSMENT & PLAN:  Assessment/Plan:  A  68 y.o. female with stage IIIA hormone receptor positive breast cancer, status post neoadjuvant chemotherapy, followed by a left mastectomy in August 2023.  Based upon her clinical breast exam today, the patient remains disease-free.  As mentioned previously, she is no longer taking her abemaciclib due to its significant GI toxicity, even at  lower doses.  She will continue to take her letrozole on a daily basis to complete 5 total years of adjuvant endocrine therapy.  Clinically, the patient is doing well.  I will see her back in 4 months for her next clinical breast exam.  Her 40-month follow-up mammogram has been scheduled in April 2025.  If her upcoming mammogram and next clinical breast exam both come back normal, I will begin spacing all future breast exams out to every 6 months.  The patient understands all the plans discussed today and is in agreement with them.    Matisse Roskelley Kirby Funk, MD

## 2024-02-06 ENCOUNTER — Ambulatory Visit: Payer: BLUE CROSS/BLUE SHIELD | Admitting: Oncology

## 2024-02-06 ENCOUNTER — Telehealth: Payer: Self-pay | Admitting: Oncology

## 2024-02-06 ENCOUNTER — Inpatient Hospital Stay: Payer: BLUE CROSS/BLUE SHIELD | Attending: Oncology | Admitting: Oncology

## 2024-02-06 VITALS — BP 139/80 | HR 64 | Temp 99.0°F | Resp 14 | Ht 67.0 in | Wt 133.3 lb

## 2024-02-06 DIAGNOSIS — Z9221 Personal history of antineoplastic chemotherapy: Secondary | ICD-10-CM | POA: Diagnosis not present

## 2024-02-06 DIAGNOSIS — Z79811 Long term (current) use of aromatase inhibitors: Secondary | ICD-10-CM | POA: Insufficient documentation

## 2024-02-06 DIAGNOSIS — C50912 Malignant neoplasm of unspecified site of left female breast: Secondary | ICD-10-CM | POA: Insufficient documentation

## 2024-02-06 DIAGNOSIS — Z9012 Acquired absence of left breast and nipple: Secondary | ICD-10-CM | POA: Diagnosis not present

## 2024-02-06 DIAGNOSIS — C50212 Malignant neoplasm of upper-inner quadrant of left female breast: Secondary | ICD-10-CM | POA: Diagnosis not present

## 2024-02-06 DIAGNOSIS — Z170 Estrogen receptor positive status [ER+]: Secondary | ICD-10-CM | POA: Diagnosis not present

## 2024-02-06 NOTE — Telephone Encounter (Signed)
 02/06/24 Spoke with patient and confirmed next appt.

## 2024-03-11 ENCOUNTER — Encounter (INDEPENDENT_AMBULATORY_CARE_PROVIDER_SITE_OTHER): Payer: BC Managed Care – PPO | Admitting: Ophthalmology

## 2024-06-08 NOTE — Progress Notes (Unsigned)
 [facility]  [address] [place],  [place]  [address] [phone]  Clinic Day:  06/10/2024  Referring physician: Jackolyn Darice BROCKS, FNP   HISTORY OF PRESENT ILLNESS:  The patient is a 68 y.o. female with stage IIIA hormone positive breast cancer.  She comes in today for routine follow-up.  Since her last visit, the patient has been doing well.  She continues to take her letrozole  on a daily basis for adjuvant endocrine therapy.  She denies having any particular changes in her right breast or left chest wall which concern her for disease recurrence.   Of note, her  39-month follow-up in April 2025 showed no evidence of disease recurrence.  Based upon this, it was recommended that she go back to annual screening mammograms.    With respect to her breast cancer history, she underwent neoadjuvant dose dense chemotherapy, which consisted of AC x 4, followed by paclitaxel  x 4, followed by a left mastectomy in August 2023.  Due to having fairly significant residual disease seen with her surgical pathology, she initially took abemaciclib /letrozole  for her adjuvant endocrine therapy.  However, despite reducing her abemaciclib  doses to offset her GI problems, her abdominal discomfort and other GI symptoms persisted to where she ultimately discontinued abemaciclib .  In November 2024.  Since doing this, all of her GI symptoms have abated.  He remains on letrozole  for her adjuvant endocrine therapy.  PHYSICAL EXAM:  Blood pressure 135/79, pulse (!) 58, temperature 98.3 F (36.8 C), temperature source Oral, resp. rate 14, height 5' 7 (1.702 m), weight 135 lb 4.8 oz (61.4 kg), SpO2 100%. Wt Readings from Last 3 Encounters:  06/10/24 135 lb 4.8 oz (61.4 kg)  02/06/24 133 lb 4.8 oz (60.5 kg)  10/09/23 133 lb 4.8 oz (60.5 kg)   Body mass index is 21.19 kg/m.  Performance status (ECOG): 0 - Asymptomatic  Physical Exam Vitals and nursing note reviewed.  Constitutional:       General: She is not in acute distress.    Appearance: Normal appearance.  HENT:     Head: Normocephalic and atraumatic.     Mouth/Throat:     Mouth: Mucous membranes are moist.     Pharynx: Oropharynx is clear. No oropharyngeal exudate or posterior oropharyngeal erythema.  Eyes:     General: No scleral icterus.    Extraocular Movements: Extraocular movements intact.     Conjunctiva/sclera: Conjunctivae normal.     Pupils: Pupils are equal, round, and reactive to light.  Cardiovascular:     Rate and Rhythm: Normal rate and regular rhythm.     Heart sounds: Normal heart sounds. No murmur heard.    No friction rub. No gallop.  Pulmonary:     Effort: Pulmonary effort is normal.     Breath sounds: Normal breath sounds. No wheezing, rhonchi or rales.  Chest:  Breasts:    Right: Normal.     Left: Absent.  Abdominal:     General: There is no distension.     Palpations: Abdomen is soft. There is no hepatomegaly, splenomegaly or mass.     Tenderness: There is no abdominal tenderness.  Musculoskeletal:        General: Normal range of motion.     Cervical back: Normal range of motion and neck supple. No tenderness.     Right lower leg: No edema.     Left lower leg: No edema.  Lymphadenopathy:     Cervical: No cervical adenopathy.  Upper Body:     Right upper body: No supraclavicular or axillary adenopathy.     Left upper body: No supraclavicular or axillary adenopathy.     Lower Body: No right inguinal adenopathy. No left inguinal adenopathy.  Skin:    General: Skin is warm and dry.     Coloration: Skin is not jaundiced.     Findings: No rash.  Neurological:     Mental Status: She is alert and oriented to person, place, and time.     Cranial Nerves: No cranial nerve deficit.  Psychiatric:        Mood and Affect: Mood normal.        Behavior: Behavior normal.        Thought Content: Thought content normal.    PATHOLOGY:  Her left mastectomy pathology (after neoadjuvant  chemotherapy) revealed the following:    ASSESSMENT & PLAN:  Assessment/Plan:  A 68 y.o. female with stage IIIA hormone receptor positive breast cancer, status post neoadjuvant chemotherapy, followed by a left mastectomy in August 2023.  Based upon her clinical breast exam today and recent annual mammogram, the patient remains disease-free.  She will continue to take her letrozole  on a daily basis to complete her 5 total years of adjuvant endocrine therapy.  I will ensure that she gets her bone density study within the next 2 months to ensure her endocrine therapy is not leading to any significant bone loss.  Clinically, the patient is doing well.  I do feel comfortable seeing her back in 6 months for her next clinical breast exam.  The patient understands all the plans discussed today and is in agreement with them.    Francesco Provencal DELENA Kerns, MD

## 2024-06-10 ENCOUNTER — Other Ambulatory Visit: Payer: Self-pay | Admitting: Oncology

## 2024-06-10 ENCOUNTER — Inpatient Hospital Stay: Attending: Oncology | Admitting: Oncology

## 2024-06-10 ENCOUNTER — Telehealth: Payer: Self-pay | Admitting: Oncology

## 2024-06-10 VITALS — BP 135/79 | HR 58 | Temp 98.3°F | Resp 14 | Ht 67.0 in | Wt 135.3 lb

## 2024-06-10 DIAGNOSIS — C50912 Malignant neoplasm of unspecified site of left female breast: Secondary | ICD-10-CM | POA: Insufficient documentation

## 2024-06-10 DIAGNOSIS — Z79811 Long term (current) use of aromatase inhibitors: Secondary | ICD-10-CM

## 2024-06-10 DIAGNOSIS — Z9012 Acquired absence of left breast and nipple: Secondary | ICD-10-CM | POA: Diagnosis not present

## 2024-06-10 DIAGNOSIS — Z9221 Personal history of antineoplastic chemotherapy: Secondary | ICD-10-CM | POA: Insufficient documentation

## 2024-06-10 DIAGNOSIS — Z170 Estrogen receptor positive status [ER+]: Secondary | ICD-10-CM | POA: Insufficient documentation

## 2024-06-10 DIAGNOSIS — C50112 Malignant neoplasm of central portion of left female breast: Secondary | ICD-10-CM | POA: Diagnosis not present

## 2024-06-10 DIAGNOSIS — C50212 Malignant neoplasm of upper-inner quadrant of left female breast: Secondary | ICD-10-CM

## 2024-06-10 NOTE — Telephone Encounter (Signed)
 Patient has been scheduled for follow-up visit per 06/10/24 LOS.  Pt given an appt calendar with date and time.

## 2024-06-14 ENCOUNTER — Ambulatory Visit (HOSPITAL_BASED_OUTPATIENT_CLINIC_OR_DEPARTMENT_OTHER)
Admission: RE | Admit: 2024-06-14 | Discharge: 2024-06-14 | Disposition: A | Discharge status: Home / Self Care | Source: Ambulatory Visit | Attending: Oncology | Admitting: Oncology

## 2024-06-14 DIAGNOSIS — C50212 Malignant neoplasm of upper-inner quadrant of left female breast: Secondary | ICD-10-CM

## 2024-06-14 DIAGNOSIS — Z79811 Long term (current) use of aromatase inhibitors: Secondary | ICD-10-CM | POA: Diagnosis not present

## 2024-06-14 DIAGNOSIS — Z170 Estrogen receptor positive status [ER+]: Secondary | ICD-10-CM

## 2024-06-21 ENCOUNTER — Other Ambulatory Visit: Payer: Self-pay

## 2024-12-10 NOTE — Progress Notes (Unsigned)
 " [facility]  [address] [place],  [place]  [address] [phone]  Clinic Day:  12/10/2024  Referring physician: Jackolyn Darice BROCKS, FNP   HISTORY OF PRESENT ILLNESS:  The patient is a 69 y.o. female with stage IIIA hormone positive breast cancer.  She comes in today for routine follow-up.  Since her last visit, the patient has been doing well.  She continues to take her letrozole  on a daily basis for adjuvant endocrine therapy.  She denies having any particular changes in her right breast or left chest wall which concern her for disease recurrence.   Of note, her  75-month follow-up in April 2025 showed no evidence of disease recurrence.  Based upon this, it was recommended that she go back to annual screening mammograms.    With respect to her breast cancer history, she underwent neoadjuvant dose dense chemotherapy, which consisted of AC x 4, followed by paclitaxel  x 4, followed by a left mastectomy in August 2023.  Due to having fairly significant residual disease seen with her surgical pathology, she initially took abemaciclib /letrozole  for her adjuvant endocrine therapy.  However, despite reducing her abemaciclib  doses to offset her GI problems, her abdominal discomfort and other GI symptoms persisted to where she ultimately discontinued abemaciclib  in November 2024.  Since doing this, all of her GI symptoms have abated.  He remains on letrozole  for her adjuvant endocrine therapy.  PHYSICAL EXAM:  There were no vitals taken for this visit. Wt Readings from Last 3 Encounters:  06/10/24 135 lb 4.8 oz (61.4 kg)  02/06/24 133 lb 4.8 oz (60.5 kg)  10/09/23 133 lb 4.8 oz (60.5 kg)   There is no height or weight on file to calculate BMI.  Performance status (ECOG): 0 - Asymptomatic  Physical Exam Vitals and nursing note reviewed.  Constitutional:      General: She is not in acute distress.    Appearance: Normal appearance.  HENT:     Head: Normocephalic  and atraumatic.     Mouth/Throat:     Mouth: Mucous membranes are moist.     Pharynx: Oropharynx is clear. No oropharyngeal exudate or posterior oropharyngeal erythema.  Eyes:     General: No scleral icterus.    Extraocular Movements: Extraocular movements intact.     Conjunctiva/sclera: Conjunctivae normal.     Pupils: Pupils are equal, round, and reactive to light.  Cardiovascular:     Rate and Rhythm: Normal rate and regular rhythm.     Heart sounds: Normal heart sounds. No murmur heard.    No friction rub. No gallop.  Pulmonary:     Effort: Pulmonary effort is normal.     Breath sounds: Normal breath sounds. No wheezing, rhonchi or rales.  Chest:  Breasts:    Right: Normal.     Left: Absent.  Abdominal:     General: There is no distension.     Palpations: Abdomen is soft. There is no hepatomegaly, splenomegaly or mass.     Tenderness: There is no abdominal tenderness.  Musculoskeletal:        General: Normal range of motion.     Cervical back: Normal range of motion and neck supple. No tenderness.     Right lower leg: No edema.     Left lower leg: No edema.  Lymphadenopathy:     Cervical: No cervical adenopathy.     Upper Body:     Right upper body: No supraclavicular or axillary adenopathy.  Left upper body: No supraclavicular or axillary adenopathy.     Lower Body: No right inguinal adenopathy. No left inguinal adenopathy.  Skin:    General: Skin is warm and dry.     Coloration: Skin is not jaundiced.     Findings: No rash.  Neurological:     Mental Status: She is alert and oriented to person, place, and time.     Cranial Nerves: No cranial nerve deficit.  Psychiatric:        Mood and Affect: Mood normal.        Behavior: Behavior normal.        Thought Content: Thought content normal.    PATHOLOGY:  Her left mastectomy pathology (after neoadjuvant chemotherapy) revealed the following:    ASSESSMENT & PLAN:  Assessment/Plan:  A 69 y.o. female with stage  IIIA hormone receptor positive breast cancer, status post neoadjuvant chemotherapy, followed by a left mastectomy in August 2023.  Based upon her clinical breast exam today and recent annual mammogram, the patient remains disease-free.  She will continue to take her letrozole  on a daily basis to complete her 5 total years of adjuvant endocrine therapy.  I will ensure that she gets her bone density study within the next 2 months to ensure her endocrine therapy is not leading to any significant bone loss.  Clinically, the patient is doing well.  I do feel comfortable seeing her back in 6 months for her next clinical breast exam.  The patient understands all the plans discussed today and is in agreement with them.    Alexandra Singh DELENA Kerns, MD        "

## 2024-12-11 ENCOUNTER — Inpatient Hospital Stay: Attending: Oncology | Admitting: Oncology

## 2024-12-11 ENCOUNTER — Other Ambulatory Visit: Payer: Self-pay | Admitting: Oncology

## 2024-12-11 VITALS — BP 138/72 | HR 61 | Temp 98.1°F | Resp 14 | Ht 67.0 in | Wt 135.0 lb

## 2024-12-11 DIAGNOSIS — C50412 Malignant neoplasm of upper-outer quadrant of left female breast: Secondary | ICD-10-CM

## 2024-12-11 DIAGNOSIS — Z9221 Personal history of antineoplastic chemotherapy: Secondary | ICD-10-CM | POA: Diagnosis not present

## 2024-12-11 DIAGNOSIS — M810 Age-related osteoporosis without current pathological fracture: Secondary | ICD-10-CM | POA: Diagnosis not present

## 2024-12-11 DIAGNOSIS — Z7983 Long term (current) use of bisphosphonates: Secondary | ICD-10-CM | POA: Insufficient documentation

## 2024-12-11 DIAGNOSIS — Z170 Estrogen receptor positive status [ER+]: Secondary | ICD-10-CM | POA: Insufficient documentation

## 2024-12-11 DIAGNOSIS — Z9012 Acquired absence of left breast and nipple: Secondary | ICD-10-CM | POA: Diagnosis not present

## 2024-12-11 DIAGNOSIS — C50912 Malignant neoplasm of unspecified site of left female breast: Secondary | ICD-10-CM | POA: Insufficient documentation

## 2024-12-11 DIAGNOSIS — Z79811 Long term (current) use of aromatase inhibitors: Secondary | ICD-10-CM | POA: Insufficient documentation

## 2024-12-11 MED ORDER — ALENDRONATE SODIUM 70 MG PO TABS: 70.0000 mg | ORAL_TABLET | ORAL | 3 refills | Status: AC

## 2025-03-11 ENCOUNTER — Encounter (INDEPENDENT_AMBULATORY_CARE_PROVIDER_SITE_OTHER): Admitting: Ophthalmology

## 2025-06-11 ENCOUNTER — Inpatient Hospital Stay: Admitting: Oncology
